# Patient Record
Sex: Male | Born: 1985
Health system: Southern US, Community
[De-identification: ages and names within clinical notes are randomized; demographics above are authoritative.]

## PROBLEM LIST (undated history)

## (undated) ENCOUNTER — Emergency Department: Discharge status: Absent without leave | Payer: Self-pay

## (undated) DIAGNOSIS — R079 Chest pain, unspecified: Secondary | ICD-10-CM

## (undated) DIAGNOSIS — E785 Hyperlipidemia, unspecified: Secondary | ICD-10-CM

## (undated) DIAGNOSIS — R519 Headache, unspecified: Secondary | ICD-10-CM

## (undated) DIAGNOSIS — Z87898 Personal history of other specified conditions: Secondary | ICD-10-CM

## (undated) DIAGNOSIS — R51 Headache: Secondary | ICD-10-CM

## (undated) DIAGNOSIS — I10 Essential (primary) hypertension: Secondary | ICD-10-CM

## (undated) HISTORY — DX: Chest pain, unspecified: R07.9

## (undated) HISTORY — DX: Hyperlipidemia, unspecified: E78.5

## (undated) HISTORY — DX: Headache, unspecified: R51.9

## (undated) HISTORY — DX: Headache: R51

## (undated) HISTORY — DX: Personal history of other specified conditions: Z87.898

## (undated) HISTORY — DX: Essential (primary) hypertension: I10

---
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
## 2014-06-24 ENCOUNTER — Encounter: Payer: Self-pay | Admitting: Cardiology

## 2014-06-24 ENCOUNTER — Ambulatory Visit (INDEPENDENT_AMBULATORY_CARE_PROVIDER_SITE_OTHER): Payer: 59 | Admitting: Cardiology

## 2014-06-24 ENCOUNTER — Encounter: Payer: Self-pay | Admitting: *Deleted

## 2014-06-24 VITALS — BP 128/82 | HR 118 | Ht 65.0 in | Wt 169.1 lb

## 2014-06-24 DIAGNOSIS — R002 Palpitations: Secondary | ICD-10-CM

## 2014-06-24 DIAGNOSIS — R0600 Dyspnea, unspecified: Secondary | ICD-10-CM

## 2014-06-24 DIAGNOSIS — R072 Precordial pain: Secondary | ICD-10-CM

## 2014-06-24 DIAGNOSIS — R079 Chest pain, unspecified: Secondary | ICD-10-CM

## 2014-06-24 LAB — CBC
HEMATOCRIT: 47 % (ref 39.0–52.0)
Hemoglobin: 16 g/dL (ref 13.0–17.0)
MCH: 28.9 pg (ref 26.0–34.0)
MCHC: 34 g/dL (ref 30.0–36.0)
MCV: 84.8 fL (ref 78.0–100.0)
MPV: 10.9 fL (ref 8.6–12.4)
Platelets: 315 10*3/uL (ref 150–400)
RBC: 5.54 MIL/uL (ref 4.22–5.81)
RDW: 13.1 % (ref 11.5–15.5)
WBC: 7.8 10*3/uL (ref 4.0–10.5)

## 2014-06-24 LAB — D-DIMER, QUANTITATIVE (NOT AT ARMC): D DIMER QUANT: 0.27 ug{FEU}/mL (ref 0.00–0.48)

## 2014-06-24 LAB — TSH: TSH: 1.28 u[IU]/mL (ref 0.350–4.500)

## 2014-06-24 NOTE — Assessment & Plan Note (Signed)
Patient states his heart rate runs in the 120 range. Right axis deviation noted on electrocardiogram. Check d-dimer. Check TSH and hemoglobin. 24-hour Holter monitor.

## 2014-06-24 NOTE — Assessment & Plan Note (Signed)
Symptoms atypical. He is noted to have sinus tachycardia and right axis deviation on his electrocardiogram. Recent travel. Check d-dimer to screen for pulmonary embolus.

## 2014-06-24 NOTE — Progress Notes (Signed)
     HPI: 29 year old male for evaluation of chest pain. Patient states that he has been told he has an elevated heart rate for at least one year. Over the past 6 months he has noticed increased dyspnea on exertion. No orthopnea, PND, pedal edema or syncope. In the past week he has had occasions of brief chest pain. It is substernal with some radiation to his left upper extremity. Described as a sharp pain. No associated symptoms. Not pleuritic, positional, exertional. Lasts 2 minutes and resolve spontaneously. Note he recently moved from [place]. He also recently traveled to [place].  Current Outpatient Prescriptions  Medication Sig Dispense Refill  . Aspirin-Acetaminophen-Caffeine (EXCEDRIN PO) Take 1 tablet by mouth as needed (Headaches).     No current facility-administered medications for this visit.    No Known Allergies   Past Medical History  Diagnosis Date  . Chest pain     Past Surgical History  Procedure Laterality Date  . Chest surgery  2003    cosmetic    History   Social History  . Marital Status: Single    Spouse Name: N/A  . Number of Children: N/A  . Years of Education: N/A   Occupational History  .      Computer   Social History Main Topics  . Smoking status: Never Smoker   . Smokeless tobacco: Not on file  . Alcohol Use: 0.0 oz/week    0 Standard drinks or equivalent per week     Comment: Occasional  . Drug Use: No  . Sexual Activity: Not on file   Other Topics Concern  . Not on file   Social History Narrative  . No narrative on file    Family History  Problem Relation Age of Onset  . CAD Mother     CABG at age 860    ROS: no fevers or chills, productive cough, hemoptysis, dysphasia, odynophagia, melena, hematochezia, dysuria, hematuria, rash, seizure activity, orthopnea, PND, pedal edema, claudication. Remaining systems are negative.  Physical Exam:   Blood pressure 128/82, pulse 118, height 5\' 5"  (1.651 m), weight 169 lb 1.9 oz  (76.712 kg), SpO2 98 %.  General:  Well developed/well nourished in NAD Skin warm/dry Patient not depressed No peripheral clubbing Back-normal HEENT-normal/normal eyelids Neck supple/normal carotid upstroke bilaterally; no bruits; no JVD; no thyromegaly chest - CTA/ normal expansion CV - RRR/normal S1 and S2; no murmurs, rubs or gallops;  PMI nondisplaced Abdomen -NT/ND, no HSM, no mass, + bowel sounds, no bruit 2+ femoral pulses, no bruits Ext-no edema, chords, 2+ DP Neuro-grossly nonfocal  ECG sinus tachycardia at a rate of 118. Right axis deviation.

## 2014-06-24 NOTE — Patient Instructions (Signed)
Your physician recommends that you schedule a follow-up appointment in: 8 WEEKS WITH DR Jens SomRENSHAW  Your physician has requested that you have an echocardiogram. Echocardiography is a painless test that uses sound waves to create images of your heart. It provides your doctor with information about the size and shape of your heart and how well your heart's chambers and valves are working. This procedure takes approximately one hour. There are no restrictions for this procedure.   Your physician has recommended that you wear a 24 HOUR holter monitor. Holter monitors are medical devices that record the heart's electrical activity. Doctors most often use these monitors to diagnose arrhythmias. Arrhythmias are problems with the speed or rhythm of the heartbeat. The monitor is a small, portable device. You can wear one while you do your normal daily activities. This is usually used to diagnose what is causing palpitations/syncope (passing out).  Your physician recommends that you HAVE LAB WORK TODAY

## 2014-06-24 NOTE — Assessment & Plan Note (Signed)
D-dimer to screen for pulmonary embolus. Schedule echo to assess LV function.

## 2014-07-01 ENCOUNTER — Ambulatory Visit (HOSPITAL_COMMUNITY): Payer: 59 | Attending: Cardiology

## 2014-07-01 ENCOUNTER — Encounter (INDEPENDENT_AMBULATORY_CARE_PROVIDER_SITE_OTHER): Payer: 59

## 2014-07-01 ENCOUNTER — Encounter: Payer: Self-pay | Admitting: *Deleted

## 2014-07-01 DIAGNOSIS — R002 Palpitations: Secondary | ICD-10-CM

## 2014-07-01 DIAGNOSIS — R079 Chest pain, unspecified: Secondary | ICD-10-CM

## 2014-07-01 NOTE — Progress Notes (Signed)
2D Echo completed. 07/01/2014 

## 2014-07-01 NOTE — Progress Notes (Signed)
Patient ID: Frank LymeLuis Bartee, male   DOB: [birth year], 29 y.o.   MRN: [id number] [facility] 24 hour holter monitor applied to patient.

## 2014-07-08 ENCOUNTER — Telehealth: Payer: Self-pay | Admitting: *Deleted

## 2014-07-08 NOTE — Telephone Encounter (Signed)
Monitor reviewed by dr Jens Somcrenshaw shows sinus rhythm.  pt aware of results

## 2014-09-02 ENCOUNTER — Ambulatory Visit: Payer: 59 | Admitting: Cardiology

## 2015-09-09 ENCOUNTER — Telehealth: Payer: Self-pay | Admitting: General Practice

## 2015-09-09 NOTE — Telephone Encounter (Signed)
°  Relation to ZO:XWRUpt:self Call back [phone] Pharmacy:  Reason for call: pt would like to know if you would accept him as a new pt, states he did a lot of research on you and feels as though you are a great fit for him for his primary care.

## 2015-09-09 NOTE — Telephone Encounter (Signed)
Approving in MD absence. Pt is a 30 yr old male w/ [employer] ins. Okay to schedule at [employer]'s convenience. Please have Pt send medical records prior to appt for review. Thank you.

## 2015-09-15 NOTE — Telephone Encounter (Signed)
appt was scheduled for pt on 09/30/15

## 2015-09-29 ENCOUNTER — Other Ambulatory Visit: Payer: Self-pay

## 2015-09-30 ENCOUNTER — Ambulatory Visit (INDEPENDENT_AMBULATORY_CARE_PROVIDER_SITE_OTHER): Payer: 59 | Admitting: Internal Medicine

## 2015-09-30 ENCOUNTER — Encounter: Payer: Self-pay | Admitting: Internal Medicine

## 2015-09-30 VITALS — BP 126/74 | HR 67 | Temp 98.0°F | Resp 12 | Ht 65.0 in | Wt 161.0 lb

## 2015-09-30 DIAGNOSIS — R030 Elevated blood-pressure reading, without diagnosis of hypertension: Secondary | ICD-10-CM

## 2015-09-30 DIAGNOSIS — IMO0001 Reserved for inherently not codable concepts without codable children: Secondary | ICD-10-CM

## 2015-09-30 DIAGNOSIS — R002 Palpitations: Secondary | ICD-10-CM | POA: Diagnosis not present

## 2015-09-30 DIAGNOSIS — R072 Precordial pain: Secondary | ICD-10-CM

## 2015-09-30 DIAGNOSIS — F411 Generalized anxiety disorder: Secondary | ICD-10-CM

## 2015-09-30 NOTE — Patient Instructions (Signed)
GO TO THE LAB : Get the blood work     GO TO THE FRONT DESK Schedule your next appointment for a  Check up in 3 months  See a counselor    Check the  blood pressure 2 or 3 times a  Week  Be sure your blood pressure is between 110/65 and  145/85. If it is consistently higher or lower, let me know   If you have severe chest pain or palpitations: go to the ER or call the office

## 2015-09-30 NOTE — Progress Notes (Signed)
Pre visit review using our clinic review tool, if applicable. No additional management support is needed unless otherwise documented below in the visit note. 

## 2015-09-30 NOTE — Progress Notes (Signed)
Subjective:    Patient ID: Frank Hancock, male    DOB: [birth year], 30 y.o.   MRN: [id number]  DOS:  09/30/2015 Type of visit - description : New patient Interval history: Patient is here with several concerns. He is having chest pain and palpitations on and off for few weeks, went to see a cardiologist 3 days ago, he was recommended a Holter monitor and a  treadmill exercise test which are pending. The cardiologist noted that he was under a lot of stress and the patient admits it. He moved from [place] a year ago, work is stressful, he had some difficulty sleeping. He was prescribed trazodone and is helping to some extent with the sleeping problem but he continued to be stressed/anxious.    Review of Systems Mild dyspnea on exertion when he runs, not new, going on since he left high school No lower extremity edema Reports shortness of breath but mostly related to nasal congestion. No depression per se He flew back from [place] about 6 weeks ago, denies any calf pain or swelling  Past Medical History:  Diagnosis Date  . Chest pain   . Frequent headaches   . H/O syncope    pre- syncope, no LOC  . History of palpitations   . Hyperlipidemia   . Hypertension         Past Surgical History:  Procedure Laterality Date  . CHEST SURGERY  2001   cosmetic, breast reduction    Social History   Social History  . Marital status: Single    Spouse name: N/A  . Number of children: 0  . Years of education: N/A   Occupational History  . Insurance underwriter - best but     Computer   Social History Main Topics  . Smoking status: Never Smoker  . Smokeless tobacco: Never Used  . Alcohol use 0.0 oz/week     Comment: Occasional  . Drug use: No  . Sexual activity: Not on file   Other Topics Concern  . Not on file   Social History Narrative   Moved from [place] 2016, lives w/ mother      Family History  Problem Relation Age of Onset  . CAD Mother 97    CABG at age 33  .  Hypertension Mother   . Hypertension Sister   . Diabetes Brother   . Hypertension Brother   . Colon cancer Neg Hx   . Prostate cancer Neg Hx        Medication List       Accurate as of 09/30/15 11:59 PM. Always use your most recent med list.          atenolol 25 MG tablet Commonly known as:  TENORMIN Take 25 mg by mouth daily.   EXCEDRIN PO Take 1 tablet by mouth as needed (Headaches).   traZODone 50 MG tablet Commonly known as:  DESYREL Take 50 mg by mouth daily.          Objective:   Physical Exam BP 126/74 (BP Location: Left Arm, Patient Position: Sitting, Cuff Size: Normal)   Pulse 67   Temp 98 F (36.7 C) (Oral)   Resp 12   Ht  (1.651 m)   Wt 161 lb (73 kg)   SpO2 99%   BMI 26.79 kg/m  General:   Well developed, well nourished . NAD.  HEENT:  Normocephalic . Face symmetric, atraumatic Lungs:  CTA B Normal respiratory effort, no intercostal retractions, no accessory  muscle use. Heart: RRR,  no murmur.  no pretibial edema bilaterally . Calves symmetric and not TTP Abdomen:  Not distended, soft, non-tender. No rebound or rigidity.  Skin: Not pale. Not jaundice Neurologic:  alert & oriented X3.  Speech normal, gait appropriate for age and unassisted Psych--  Cognition and judgment appear intact.  Cooperative with normal attention span and concentration.  Behavior appropriate. Slightly anxious but not depressed appearing.    Assessment & Plan:   Assessment HTN dx at a UC ~ 08-2015 rx atenolol Chest pain, palpitations:  Saw cards Dr Jens Som 06-24-14  - d-dimer/echo wnl;  Rx Holter monitor not done Saw Dr Beverely Pace 09-27-15 >>> rx holter-treadmill (not done as of 09-30-15)   PLAN: Chest pain, palpitations:  similar sx last year, saw Dr. Jens Som, d-dimer and echo were normal. Now has seen Dr. Beverely Pace, Holter-treadmill are pending, very low suspicion for a PE, if sx persist consider check a d-dimer. Anxiety: Seems to be work related, had also some  insomnia which is better with trazodone. We discussed counseling or medication, information about local counselors provided, not ready for medications but will let me know when/if ready. Elevated BP, HTN? Recently rx atenolol, BP is excellent, check a BMP and CBC. TSH last year was normal RTC 3 months

## 2015-10-01 DIAGNOSIS — F411 Generalized anxiety disorder: Secondary | ICD-10-CM | POA: Insufficient documentation

## 2015-10-01 DIAGNOSIS — Z09 Encounter for follow-up examination after completed treatment for conditions other than malignant neoplasm: Secondary | ICD-10-CM | POA: Insufficient documentation

## 2015-10-01 LAB — COMPREHENSIVE METABOLIC PANEL
ALBUMIN: 4.8 g/dL (ref 3.5–5.2)
ALT: 70 U/L — ABNORMAL HIGH (ref 0–53)
AST: 29 U/L (ref 0–37)
Alkaline Phosphatase: 64 U/L (ref 39–117)
BUN: 11 mg/dL (ref 6–23)
CALCIUM: 10.1 mg/dL (ref 8.4–10.5)
CHLORIDE: 107 meq/L (ref 96–112)
CO2: 22 mEq/L (ref 19–32)
CREATININE: 0.93 mg/dL (ref 0.40–1.50)
GFR: 100.45 mL/min (ref >60.00)
Glucose, Bld: 81 mg/dL (ref 70–99)
Potassium: 4.3 mEq/L (ref 3.5–5.1)
Sodium: 138 mEq/L (ref 135–145)
Total Bilirubin: 0.5 mg/dL (ref 0.2–1.2)
Total Protein: 8 g/dL (ref 6.0–8.3)

## 2015-10-01 LAB — CBC WITH DIFFERENTIAL/PLATELET
BASOS PCT: 0.4 % (ref 0.0–3.0)
Basophils Absolute: 0 10*3/uL (ref 0.0–0.1)
EOS ABS: 0.1 10*3/uL (ref 0.0–0.7)
EOS PCT: 1.4 % (ref 0.0–5.0)
HEMATOCRIT: 47.1 % (ref 39.0–52.0)
HEMOGLOBIN: 15.6 g/dL (ref 13.0–17.0)
LYMPHS PCT: 37.1 % (ref 12.0–46.0)
Lymphs Abs: 2.5 10*3/uL (ref 0.7–4.0)
MCHC: 33.1 g/dL (ref 30.0–36.0)
MCV: 84.3 fl (ref 78.0–100.0)
Monocytes Absolute: 0.5 10*3/uL (ref 0.1–1.0)
Monocytes Relative: 7.6 % (ref 3.0–12.0)
Neutro Abs: 3.6 10*3/uL (ref 1.4–7.7)
Neutrophils Relative %: 53.5 % (ref 43.0–77.0)
Platelets: 274 10*3/uL (ref 150.0–400.0)
RBC: 5.59 Mil/uL (ref 4.22–5.81)
RDW: 14 % (ref 11.5–15.5)
WBC: 6.7 10*3/uL (ref 4.0–10.5)

## 2015-10-01 NOTE — Assessment & Plan Note (Signed)
Chest pain, palpitations:  similar sx last year, saw Dr. Jens Som, d-dimer and echo were normal. Now has seen Dr. Beverely Pace, Holter-treadmill are pending, very low suspicion for a PE, if sx persist consider check a d-dimer. Anxiety: Seems to be work related, had also some insomnia which is better with trazodone. We discussed counseling or medication, information about local counselors provided, not ready for medications but will let me know when/if ready. Elevated BP, HTN? Recently rx atenolol, BP is excellent, check a BMP and CBC. TSH last year was normal RTC 3 months

## 2015-11-06 NOTE — Progress Notes (Deleted)
      HPI: FU CP. Echo 4/16 showed normal LV function; grade 1 DD. Holter 4/16 showed sinus rhythm. Since last seen,   Current Outpatient Prescriptions  Medication Sig Dispense Refill  . Aspirin-Acetaminophen-Caffeine (EXCEDRIN PO) Take 1 tablet by mouth as needed (Headaches).    Marland Kitchen. atenolol (TENORMIN) 25 MG tablet Take 25 mg by mouth daily.    . traZODone (DESYREL) 50 MG tablet Take 50 mg by mouth daily.     No current facility-administered medications for this visit.      Past Medical History:  Diagnosis Date  . Chest pain   . Frequent headaches   . H/O syncope    pre- syncope, no LOC  . History of palpitations   . Hyperlipidemia   . Hypertension         Past Surgical History:  Procedure Laterality Date  . CHEST SURGERY  2001   cosmetic, breast reduction    Social History   Social History  . Marital status: Single    Spouse name: N/A  . Number of children: 0  . Years of education: N/A   Occupational History  . 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 Smoking status: Never Smoker  . Smokeless tobacco: Never Used  . Alcohol use 0.0 oz/week     Comment: Occasional  . Drug use: No  . Sexual activity: Not on file   Other Topics Concern  . Not on file   Social History Narrative   Moved from [place] 2016, lives w/ mother     Family History  Problem Relation Age of Onset  . CAD Mother 7364    CABG at age 31  . Hypertension Mother   . Hypertension Sister   . Diabetes Brother   . Hypertension Brother   . Colon cancer Neg Hx   . Prostate cancer Neg Hx     ROS: no fevers or chills, productive cough, hemoptysis, dysphasia, odynophagia, melena, hematochezia, dysuria, hematuria, rash, seizure activity, orthopnea, PND, pedal edema, claudication. Remaining systems are negative.  Physical Exam: Well-developed well-nourished in no acute distress.  Skin is warm and dry.  HEENT is normal.  Neck is supple.  Chest is clear to  auscultation with normal expansion.  Cardiovascular exam is regular rate and rhythm.  Abdominal exam nontender or distended. No masses palpated. Extremities show no edema. neuro grossly intact  ECG

## 2015-11-10 ENCOUNTER — Ambulatory Visit: Payer: 59 | Admitting: Cardiology

## 2015-12-21 ENCOUNTER — Ambulatory Visit (INDEPENDENT_AMBULATORY_CARE_PROVIDER_SITE_OTHER): Payer: 59 | Admitting: Internal Medicine

## 2015-12-21 ENCOUNTER — Encounter (INDEPENDENT_AMBULATORY_CARE_PROVIDER_SITE_OTHER): Payer: Self-pay

## 2015-12-21 ENCOUNTER — Encounter: Payer: Self-pay | Admitting: Internal Medicine

## 2015-12-21 VITALS — BP 108/72 | HR 92 | Temp 98.2°F | Resp 14 | Ht 65.0 in | Wt 164.2 lb

## 2015-12-21 DIAGNOSIS — Z23 Encounter for immunization: Secondary | ICD-10-CM | POA: Diagnosis not present

## 2015-12-21 DIAGNOSIS — Z114 Encounter for screening for human immunodeficiency virus [HIV]: Secondary | ICD-10-CM

## 2015-12-21 DIAGNOSIS — Z0000 Encounter for general adult medical examination without abnormal findings: Secondary | ICD-10-CM | POA: Diagnosis not present

## 2015-12-21 LAB — HEPATIC FUNCTION PANEL
ALBUMIN: 5.1 g/dL (ref 3.5–5.2)
ALK PHOS: 74 U/L (ref 39–117)
ALT: 32 U/L (ref 0–53)
AST: 20 U/L (ref 0–37)
BILIRUBIN DIRECT: 0.1 mg/dL (ref 0.0–0.3)
TOTAL PROTEIN: 8.7 g/dL — AB (ref 6.0–8.3)
Total Bilirubin: 0.6 mg/dL (ref 0.2–1.2)

## 2015-12-21 LAB — LIPID PANEL
CHOL/HDL RATIO: 5
CHOLESTEROL: 182 mg/dL (ref 0–200)
HDL: 36.7 mg/dL — AB (ref >39.00)
LDL CALC: 108 mg/dL — AB (ref 0–99)
NonHDL: 145.28
TRIGLYCERIDES: 188 mg/dL — AB (ref 0.0–149.0)
VLDL: 37.6 mg/dL (ref 0.0–40.0)

## 2015-12-21 LAB — TSH: TSH: 1.36 u[IU]/mL (ref 0.35–4.50)

## 2015-12-21 NOTE — Progress Notes (Signed)
Subjective:    Patient ID: Frank Hancock, male    DOB: 09-02-1985, 30 y.o.   MRN: [id number]  DOS:  12/21/2015 Type of visit - description : CPX Interval history: No major concerns. Stop essentially all meds medications few weeks ago and feeling well    Review of Systems  Constitutional: No fever. No chills. No unexplained wt changes. No unusual sweats  HEENT: No dental problems, no ear discharge, no facial swelling, no voice changes. No eye discharge, no eye  redness , no  intolerance to light   Respiratory: No wheezing , no  difficulty breathing. No cough , no mucus production  Cardiovascular: since last visit, cardiac workup was negative, chest pain palpitations have significantly decreased, from time to time has a left-sided chest pain that lasts few seconds.  GI: no nausea, no vomiting, no diarrhea , no  abdominal pain.  No blood in the stools. No dysphagia, no odynophagia    Endocrine: No polyphagia, no polyuria , no polydipsia  GU: No dysuria, gross hematuria, difficulty urinating. No urinary urgency, no frequency.  Musculoskeletal: No joint swellings or unusual aches or pains  Skin: No change in the color of the skin, palor , no  Rash  Allergic, immunologic: No environmental allergies , no  food allergies  Neurological: No dizziness no  syncope. No headaches. No diplopia, no slurred, no slurred speech, no motor deficits, no facial  Numbness  Hematological: No enlarged lymph nodes, no easy bruising , no unusual bleedings  Psychiatry: No suicidal ideas, no hallucinations, no beavior problems, no confusion.  No unusual/severe anxiety, no depression   Past Medical History:  Diagnosis Date  . Chest pain   . Frequent headaches   . H/O syncope    pre- syncope, no LOC  . History of palpitations   . Hyperlipidemia   . Hypertension         Past Surgical History:  Procedure Laterality Date  . CHEST SURGERY  2001   cosmetic, breast reduction    Social History     Social History  . Marital status: Single    Spouse name: N/A  . Number of children: 0  . Years of education: N/A   Occupational History  . Insurance underwriter - best but     Computer   Social History Main Topics  . Smoking status: Never Smoker  . Smokeless tobacco: Never Used  . Alcohol use 0.0 oz/week     Comment: Occasional  . Drug use: No  . Sexual activity: Not on file   Other Topics Concern  . Not on file   Social History Narrative   Moved from [place] 2016, lives w/ mother      Family History  Problem Relation Age of Onset  . CAD Mother 27    CABG at age 54  . Hypertension Mother   . Hypertension Sister   . Diabetes Brother   . Hypertension Brother   . Colon cancer Neg Hx   . Prostate cancer Neg Hx        Medication List       Accurate as of 12/21/15  2:02 PM. Always use your most recent med list.          atenolol 25 MG tablet Commonly known as:  TENORMIN Take 25 mg by mouth daily.   EXCEDRIN PO Take 1 tablet by mouth as needed (Headaches).   traZODone 50 MG tablet Commonly known as:  DESYREL Take 50 mg by mouth daily.  Objective:   Physical Exam BP 108/72 (BP Location: Right Arm, Patient Position: Sitting, Cuff Size: Normal)   Pulse 92   Temp 98.2 F (36.8 C) (Oral)   Resp 14   Ht 5\' 5"  (1.651 m)   Wt 164 lb 4 oz (74.5 kg)   SpO2 99%   BMI 27.33 kg/m   General:   Well developed, well nourished . NAD.  Neck: No  thyromegaly  HEENT:  Normocephalic . Face symmetric, atraumatic Lungs:  CTA B Normal respiratory effort, no intercostal retractions, no accessory muscle use. Heart: RRR,  no murmur.  No pretibial edema bilaterally  Abdomen:  Not distended, soft, non-tender. No rebound or rigidity.   Skin: Exposed areas without rash. Not pale. Not jaundice Neurologic:  alert & oriented X3.  Speech normal, gait appropriate for age and unassisted Strength symmetric and appropriate for age.  Psych: Cognition and judgment  appear intact.  Cooperative with normal attention span and concentration.  Behavior appropriate. No anxious or depressed appearing.    Assessment & Plan:   Assessment HTN dx at a UC ~ 08-2015 rx atenolol Chest pain, palpitations, h/o pre-syncope:  --Saw cards Dr Jens Somrenshaw 06-24-14  - d-dimer/echo wnl;  Rx Holter monitor not done --Saw Dr Beverely Paceheek 09-27-15 >>>  holter-treadmill --WNL  Plan: Palpitations, chest pain: Workup negative, sx going away. Rec  no further eval. Elevated BP: Self d/c atenolol few weeks ago, BP today is normal, recommend ambulatory BPs from time to time Anxiety: Much improved, not needing any medication. Increase LFTs: See most recent labs, recheck. (Not a drinker) RTC one year

## 2015-12-21 NOTE — Assessment & Plan Note (Addendum)
Td today; declined flu shot  Never had a cscope  Labs: LFTs, TSH, FLP, HIV Patient education: Diet, exercise, safe sex, self testicular exam

## 2015-12-21 NOTE — Patient Instructions (Signed)
GO TO THE LAB : Get the blood work     GO TO THE FRONT DESK Schedule your next appointment for a physical exam in one year     Check the  blood pressure   monthly  Be sure your blood pressure is between 110/65 and  145/85. If it is consistently higher or lower, let me know   Safe Sex Safe sex is about reducing the risk of giving or getting a sexually transmitted disease (STD). STDs are spread through sexual contact involving the genitals, mouth, or rectum. Some STDs can be cured and others cannot. Safe sex can also prevent unintended pregnancies.  WHAT ARE SOME SAFE SEX PRACTICES?  Limit your sexual activity to only one partner who is having sex with only you.  Talk to your partner about his or her past partners, past STDs, and drug use.  Use a condom every time you have sexual intercourse. This includes vaginal, oral, and anal sexual activity. Both females and males should wear condoms during oral sex. Only use latex or polyurethane condoms and water-based lubricants. Using petroleum-based lubricants or oils to lubricate a condom will weaken the condom and increase the chance that it will break. The condom should be in place from the beginning to the end of sexual activity. Wearing a condom reduces, but does not completely eliminate, your risk of getting or giving an STD. STDs can be spread by contact with infected body fluids and skin.  Get vaccinated for hepatitis B and HPV.  Avoid alcohol and recreational drugs, which can affect your judgment. You may forget to use a condom or participate in high-risk sex.  For females, avoid douching after sexual intercourse. Douching can spread an infection farther into the reproductive tract.  Check your body for signs of sores, blisters, rashes, or unusual discharge. See your health care provider if you notice any of these signs.  Avoid sexual contact if you have symptoms of an infection or are being treated for an STD. If you or your partner  has herpes, avoid sexual contact when blisters are present. Use condoms at all other times.  If you are at risk of being infected with HIV, it is recommended that you take a prescription medicine daily to prevent HIV infection. This is called pre-exposure prophylaxis (PrEP). You are considered at risk if:  You are a man who has sex with other men (MSM).  You are a heterosexual man or woman who is sexually active with more than one partner.  You take drugs by injection.  You are sexually active with a partner who has HIV.  Talk with your health care provider about whether you are at high risk of being infected with HIV. If you choose to begin PrEP, you should first be tested for HIV. You should then be tested every 3 months for as long as you are taking PrEP.  See your health care provider for regular screenings, exams, and tests for other STDs. Before having sex with a new partner, each of you should be screened for STDs and should talk about the results with each other. WHAT ARE THE BENEFITS OF SAFE SEX?   There is less chance of getting or giving an STD.  You can prevent unwanted or unintended pregnancies.  By discussing safe sex concerns with your partner, you may increase feelings of intimacy, comfort, trust, and honesty between the two of you.   This information is not intended to replace advice given to you by your  health care provider. Make sure you discuss any questions you have with your health care provider.   Document Released: 03/30/2004 Document Revised: 03/13/2014 Document Reviewed: 08/14/2011 Elsevier Interactive Patient Education 2016 [employer]      Testicular Self-Exam A self-examination of your testicles involves looking at and feeling your testicles for abnormal lumps or swelling. Several things can cause swelling, lumps, or pain in your testicles. Some of these causes are:  Injuries.  Inflammation.  Infection.  Accumulation of fluids around your  testicle (hydrocele).  Twisted testicles (testicular torsion).  Testicular cancer. Self-examination of the testicles and groin areas may be advised if you are at risk for testicular cancer. Risks for testicular cancer include:  An undescended testicle (cryptorchidism).  A history of previous testicular cancer.  A family history of testicular cancer. The testicles are easiest to examine after warm baths or showers and are more difficult to examine when you are cold. This is because the muscles attached to the testicles retract and pull them up higher or into the abdomen. Follow these steps while you are standing:  Hold your penis away from your body.  Roll one testicle between your thumb and forefinger, feeling the entire testicle.  Roll the other testicle between your thumb and forefinger, feeling the entire testicle. Feel for lumps, swelling, or discomfort. A normal testicle is egg shaped and feels firm. It is smooth and not tender. The spermatic cord can be felt as a firm spaghetti-like cord at the back of your testicle. It is also important to examine the crease between the front of your leg and your abdomen. Feel for any bumps that are tender. These could be enlarged lymph nodes.    This information is not intended to replace advice given to you by your health care provider. Make sure you discuss any questions you have with your health care provider.   Document Released: 05/29/2000 Document Revised: 10/23/2012 Document Reviewed: 08/12/2012 Elsevier Interactive Patient Education [employer]2016 Else[employer] Inc[employer]

## 2015-12-21 NOTE — Progress Notes (Signed)
Pre visit review using our clinic review tool, if applicable. No additional management support is needed unless otherwise documented below in the visit note. 

## 2015-12-22 LAB — HIV ANTIBODY (ROUTINE TESTING W REFLEX): HIV 1&2 Ab, 4th Generation: NONREACTIVE

## 2015-12-22 NOTE — Assessment & Plan Note (Signed)
Palpitations, chest pain: Workup negative, sx going away. Rec  no further eval. Elevated BP: Self d/c atenolol few weeks ago, BP today is normal, recommend ambulatory BPs from time to time Anxiety: Much improved, not needing any medication. Increase LFTs: See most recent labs, recheck. (Not a drinker) RTC one year

## 2016-03-20 ENCOUNTER — Other Ambulatory Visit (INDEPENDENT_AMBULATORY_CARE_PROVIDER_SITE_OTHER): Payer: Self-pay | Admitting: Otolaryngology

## 2016-03-20 DIAGNOSIS — J329 Chronic sinusitis, unspecified: Secondary | ICD-10-CM

## 2016-03-22 ENCOUNTER — Other Ambulatory Visit: Payer: 59

## 2016-03-24 ENCOUNTER — Ambulatory Visit
Admission: RE | Admit: 2016-03-24 | Discharge: 2016-03-24 | Disposition: A | Discharge status: Home / Self Care | Payer: Commercial Managed Care - HMO | Source: Ambulatory Visit | Attending: Otolaryngology | Admitting: Otolaryngology

## 2016-03-24 DIAGNOSIS — J329 Chronic sinusitis, unspecified: Secondary | ICD-10-CM

## 2016-12-21 ENCOUNTER — Encounter: Payer: 59 | Admitting: Internal Medicine

## 2017-01-04 ENCOUNTER — Ambulatory Visit (INDEPENDENT_AMBULATORY_CARE_PROVIDER_SITE_OTHER): Payer: 59 | Admitting: Internal Medicine

## 2017-01-04 ENCOUNTER — Encounter: Payer: Self-pay | Admitting: Internal Medicine

## 2017-01-04 VITALS — BP 128/80 | HR 108 | Temp 98.3°F | Resp 14 | Ht 65.0 in | Wt 160.4 lb

## 2017-01-04 DIAGNOSIS — Z1159 Encounter for screening for other viral diseases: Secondary | ICD-10-CM

## 2017-01-04 DIAGNOSIS — F411 Generalized anxiety disorder: Secondary | ICD-10-CM | POA: Diagnosis not present

## 2017-01-04 DIAGNOSIS — Z114 Encounter for screening for human immunodeficiency virus [HIV]: Secondary | ICD-10-CM

## 2017-01-04 DIAGNOSIS — Z0000 Encounter for general adult medical examination without abnormal findings: Secondary | ICD-10-CM

## 2017-01-04 LAB — CBC WITH DIFFERENTIAL/PLATELET
BASOS ABS: 0 10*3/uL (ref 0.0–0.1)
Basophils Relative: 0.4 % (ref 0.0–3.0)
EOS PCT: 0.3 % (ref 0.0–5.0)
Eosinophils Absolute: 0 10*3/uL (ref 0.0–0.7)
HEMATOCRIT: 45.7 % (ref 39.0–52.0)
Hemoglobin: 15.1 g/dL (ref 13.0–17.0)
LYMPHS PCT: 34.2 % (ref 12.0–46.0)
Lymphs Abs: 3.4 10*3/uL (ref 0.7–4.0)
MCHC: 33 g/dL (ref 30.0–36.0)
MCV: 84.5 fl (ref 78.0–100.0)
MONOS PCT: 7.5 % (ref 3.0–12.0)
Monocytes Absolute: 0.7 10*3/uL (ref 0.1–1.0)
Neutro Abs: 5.7 10*3/uL (ref 1.4–7.7)
Neutrophils Relative %: 57.6 % (ref 43.0–77.0)
Platelets: 288 10*3/uL (ref 150.0–400.0)
RBC: 5.4 Mil/uL (ref 4.22–5.81)
RDW: 13 % (ref 11.5–15.5)
WBC: 9.9 10*3/uL (ref 4.0–10.5)

## 2017-01-04 LAB — COMPREHENSIVE METABOLIC PANEL
ALK PHOS: 57 U/L (ref 39–117)
ALT: 42 U/L (ref 0–53)
AST: 22 U/L (ref 0–37)
Albumin: 4.7 g/dL (ref 3.5–5.2)
BILIRUBIN TOTAL: 0.8 mg/dL (ref 0.2–1.2)
BUN: 12 mg/dL (ref 6–23)
CALCIUM: 9.9 mg/dL (ref 8.4–10.5)
CO2: 28 meq/L (ref 19–32)
Chloride: 103 mEq/L (ref 96–112)
Creatinine, Ser: 0.85 mg/dL (ref 0.40–1.50)
GFR: 110.55 mL/min (ref >60.00)
Glucose, Bld: 97 mg/dL (ref 70–99)
POTASSIUM: 3.4 meq/L — AB (ref 3.5–5.1)
Sodium: 139 mEq/L (ref 135–145)
TOTAL PROTEIN: 7.9 g/dL (ref 6.0–8.3)

## 2017-01-04 LAB — TSH: TSH: 2.02 u[IU]/mL (ref 0.35–4.50)

## 2017-01-04 MED ORDER — ESCITALOPRAM OXALATE 10 MG PO TABS: 10.0000 mg | ORAL_TABLET | Freq: Every day | ORAL | 1 refills | Status: DC

## 2017-01-04 NOTE — Progress Notes (Signed)
Pre visit review using our clinic review tool, if applicable. No additional management support is needed unless otherwise documented below in the visit note. 

## 2017-01-04 NOTE — Patient Instructions (Addendum)
GO TO THE LAB : Get the blood work     GO TO THE FRONT DESK Schedule your next appointment for a follow-up in 4 weeks   Start Lexapro, 1 tablet every night.  Watch for suicidal ideas.  Suicide hotline 1 800 [phone]

## 2017-01-04 NOTE — Progress Notes (Signed)
Subjective:    Patient ID: Frank Hancock, male    DOB: 1985/07/04, 31 y.o.   MRN: [id number]  DOS:  01/04/2017 Type of visit - description : cpx, here with his mother Interval history: In addition to CPX he has other concerns   Review of Systems Patient went to [place], has a girlfriend there, before they were sexually active they both had complete physical, they tested negative for HIV, RPR, etc. They did a fecal test for H. pylori and he came back positive, unclear to me why they check that. After the testing they were sexually active, the condom broke once and the patient is extremely anxious about it. He came back to the [place] on 12/02/2016. He started to take the H. pylori medication prescribed in [place], shortly after he developed diarrhea, nausea, low-grade fever, a "chill" sensation in the back. Eventually went to urgent care approximately 12/18/2016, H. pylori medication was discontinued, he was prescribed dexamethasone and Levaquin.  At this point, his most pressing symptom is anxiety, he is extremely anxious about HIV.  In fact, he reports gotl tested twice  for HIV since then and they came back negative. Cannot sleep, when asked admits to some depression triggered by the extreme anxiety.  Suicidal ideas has crossed his mind, last time was last week.  At this point denies fever or chills.  Still has occasional nausea and diarrhea but no vomiting or blood in the stools No dysuria or gross hematuria + Poor sleep Occasionally has seen a skin lesion in the arms but no other rashes.   Other than above, a 14 point review of systems is negative      Past Medical History:  Diagnosis Date  . Chest pain   . Frequent headaches   . H/O syncope    pre- syncope, no LOC  . History of palpitations   . Hyperlipidemia   . Hypertension         Past Surgical History:  Procedure Laterality Date  . CHEST SURGERY  2001   cosmetic, breast reduction    Social History   Social  History  . Marital status: Single    Spouse name: N/A  . Number of children: 0  . Years of education: N/A   Occupational History  . 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 Smoking status: Never Smoker  . Smokeless tobacco: Never Used  . Alcohol use 0.0 oz/week     Comment: Occasional  . Drug use: No  . Sexual activity: Not on file   Other Topics Concern  . Not on file   Social History Narrative   Born in [place]   Moved from [place] 2016, lives w/ mother       Family History  Problem Relation Age of Onset  . Hypertension Mother   . Hypertension Sister   . Diabetes Brother   . Hypertension Brother   . Heart defect Maternal Grandmother        valve repair  . Colon cancer Neg Hx   . Prostate cancer Neg Hx   . CAD Neg Hx       Allergies as of 01/04/2017   No Known Allergies     Medication List       Accurate as of 01/04/17 11:59 PM. Always use your most recent med list.          escitalopram 10 MG tablet Commonly known as:  LEXAPRO  Take 1 tablet (10 mg total) by mouth daily.   EXCEDRIN PO Take 1 tablet by mouth as needed (Headaches).   LORazepam 1 MG tablet Commonly known as:  ATIVAN Take 1 mg by mouth at bedtime as needed.          Objective:   Physical Exam BP 128/80 (BP Location: Left Arm, Patient Position: Sitting, Cuff Size: Small)   Pulse (!) 108   Temp 98.3 F (36.8 C) (Oral)   Resp 14   Ht 5\' 5"  (1.651 m)   Wt 160 lb 6 oz (72.7 kg)   SpO2 98%   BMI 26.69 kg/m   General:   Well developed, well nourished . NAD.  Neck: No  thyromegaly  HEENT:  Normocephalic . Face symmetric, atraumatic Lungs:  CTA B Normal respiratory effort, no intercostal retractions, no accessory muscle use. Heart: RRR,  no murmur.  No pretibial edema bilaterally  Abdomen:  Not distended, soft, non-tender. No rebound or rigidity.   Skin: He showed me a couple of skin lesions at the inner aspect of the right arm, they are  round, less than 1 mm, slightly red. Neurologic:  alert & oriented X3.  Speech normal, gait appropriate for age and unassisted Strength symmetric and appropriate for age.  Psych: Cognition and judgment appear intact.  Cooperative with normal attention span and concentration.  Behavior appropriate. No anxious or depressed appearing.     Assessment & Plan:   Assessment (new pt 09-2015) HTN dx at a UC ~ 08-2015 rx atenolol HAs, frequent, Excedrin  Chest pain, palpitations, h/o pre-syncope:  --Saw cards Dr Jens Somrenshaw 06-24-14  - d-dimer/echo wnl;  Rx Holter monitor not done --Saw Dr Beverely Paceheek 09-27-15 >>>  holter-treadmill --WNL  PLAN: Anxiety: This is the main pt's problem today, this is a chronic issue, recently exacerbated by a situation that may have put him at risk to acquire HIV.  His chances to get HIV were neil,  See HPI He has some depression, I think  triggered by feeling  so anxious. Occasional suicidal ideas. Recommend counseling, information provided I think he needs to start a SSRI, patient agrees.  Will start Lexapro, suicidal risk discussed in great detail with the patient and his mother who is here.  He knows to call and seek for help if S/I come again + H. pylori testing: Done elsewhere.  Would recommend breath test in few weeks HIV exposure?  The patient showed me results done at [facility] done in [place] lately, he had a hepatitis A, B, and C serology negative. HIV 1 and HIV 2  RNA  were negative. Will come back in 4 weeks  Today, in addition to the physical exam, I spent more than  20  min with the patient and his mother. >50% of the time counseling regards anxiety, going over all his recent medical history, counseling, reassuring him.  A number of questions were answered to the best of my ability.  We also discussed extensively risk of suicide.

## 2017-01-04 NOTE — Assessment & Plan Note (Addendum)
-  Td 2017 -CCS: Never had a cscope  -Labs: CMP, CBC, TSH, HIV, hep C, RPR  -Diet, exercise discussed

## 2017-01-05 LAB — RPR: RPR Ser Ql: NONREACTIVE

## 2017-01-05 LAB — HIV ANTIBODY (ROUTINE TESTING W REFLEX): HIV 1&2 Ab, 4th Generation: NONREACTIVE

## 2017-01-05 LAB — HEPATITIS C ANTIBODY
HEP C AB: NONREACTIVE
SIGNAL TO CUT-OFF: 0 (ref <1.00)

## 2017-01-05 NOTE — Assessment & Plan Note (Signed)
Anxiety: This is the main pt's problem today, this is a chronic issue, recently exacerbated by a situation that may have put him at risk to acquire HIV.  His chances to get HIV were neil,  See HPI He has some depression, I think  triggered by feeling  so anxious. Occasional suicidal ideas. Recommend counseling, information provided I think he needs to start a SSRI, patient agrees.  Will start Lexapro, suicidal risk discussed in great detail with the patient and his mother who is here.  He knows to call and seek for help if S/I come again + H. pylori testing: Done elsewhere.  Would recommend breath test in few weeks HIV exposure?  The patient showed me results done at [facility] done in [place] lately, he had a hepatitis A, B, and C serology negative. HIV 1 and HIV 2  RNA  were negative. Will come back in 4 weeks

## 2017-01-10 ENCOUNTER — Ambulatory Visit (INDEPENDENT_AMBULATORY_CARE_PROVIDER_SITE_OTHER): Payer: 59 | Admitting: Internal Medicine

## 2017-01-10 ENCOUNTER — Encounter: Payer: Self-pay | Admitting: Internal Medicine

## 2017-01-10 ENCOUNTER — Encounter: Payer: Commercial Managed Care - HMO | Admitting: Internal Medicine

## 2017-01-10 VITALS — BP 124/78 | HR 118 | Temp 97.9°F | Resp 14 | Ht 65.0 in | Wt 155.1 lb

## 2017-01-10 DIAGNOSIS — R208 Other disturbances of skin sensation: Secondary | ICD-10-CM | POA: Diagnosis not present

## 2017-01-10 DIAGNOSIS — M545 Low back pain, unspecified: Secondary | ICD-10-CM

## 2017-01-10 DIAGNOSIS — R197 Diarrhea, unspecified: Secondary | ICD-10-CM

## 2017-01-10 LAB — SEDIMENTATION RATE: Sed Rate: 13 mm/hr (ref 0–15)

## 2017-01-10 LAB — VITAMIN B12: Vitamin B-12: 665 pg/mL (ref 211–911)

## 2017-01-10 LAB — FOLATE: Folate: 14.3 ng/mL (ref >5.9)

## 2017-01-10 MED ORDER — CYCLOBENZAPRINE HCL 10 MG PO TABS: 10.0000 mg | ORAL_TABLET | Freq: Every evening | ORAL | 0 refills | Status: DC | PRN

## 2017-01-10 MED ORDER — MELOXICAM 15 MG PO TABS: 15.0000 mg | ORAL_TABLET | Freq: Every day | ORAL | 0 refills | Status: DC

## 2017-01-10 NOTE — Progress Notes (Signed)
Subjective:    Patient ID: Frank Hancock, male    DOB: March 05, 1986, 31 y.o.   MRN: [id number]  DOS:  01/10/2017 Type of visit - description : acute Interval history: Has a number of symptoms: 3 weeks history of mid low back pain with no radiation, worse when he sits for too long, decrease when he lays down. Denies any bladder or bowel incontinence Continue w/ an "icy hot" and  feeling of "burning", not only at the back ( T and L spine) but he also stated at the arms and legs including palms and feet. Has nausea, diarrhea "every day". Also urinary frequency, "urinate 10 times a day".  No dysuria no gross hematuria Has developed a rash for the last month, not itchy, only on the flanks and extremities.  Anxiety: Started Lexapro, reports he is feeling more calm.    Review of Systems   Past Medical History:  Diagnosis Date  . Chest pain   . Frequent headaches   . H/O syncope    pre- syncope, no LOC  . History of palpitations   . Hyperlipidemia   . Hypertension         Past Surgical History:  Procedure Laterality Date  . CHEST SURGERY  2001   cosmetic, breast reduction    Social History   Socioeconomic History  . Marital status: Single    Spouse name: Not on file  . Number of children: 0  . Years of education: Not on file  . Highest education level: Not on file  Social Needs  . Financial resource strain: Not on file  . Food insecurity - worry: Not on file  . Food insecurity - inability: Not on file  . Transportation needs - medical: Not on file  . Transportation needs - non-medical: Not on file  Occupational History  . Occupation: Insurance underwritercomputer tech - [employer]    Comment: Computer  Tobacco Use  . Smoking status: Never Smoker  . Smokeless tobacco: Never Used  Substance and Sexual Activity  . Alcohol use: Yes    Alcohol/week: 0.0 oz    Comment: Occasional  . Drug use: No  . Sexual activity: Not on file  Other Topics Concern  . Not on file  Social History  Narrative   Born in [place]   Moved from [place] 2016, lives w/ mother       Allergies as of 01/10/2017   No Known Allergies     Medication List        Accurate as of 01/10/17  8:36 PM. Always use your most recent med list.          cyclobenzaprine 10 MG tablet Commonly known as:  FLEXERIL Take 1 tablet (10 mg total) at bedtime as needed by mouth for muscle spasms.   escitalopram 10 MG tablet Commonly known as:  LEXAPRO Take 1 tablet (10 mg total) by mouth daily.   EXCEDRIN PO Take 1 tablet by mouth as needed (Headaches).   LORazepam 1 MG tablet Commonly known as:  ATIVAN Take 1 mg by mouth at bedtime as needed.   meloxicam 15 MG tablet Commonly known as:  MOBIC Take 1 tablet (15 mg total) daily by mouth.          Objective:   Physical Exam BP 124/78 (BP Location: Left Arm, Patient Position: Sitting, Cuff Size: Small)   Pulse (!) 118   Temp 97.9 F (36.6 C) (Oral)   Resp 14   Ht 5\' 5"  (1.651  m)   Wt 155 lb 2 oz (70.4 kg)   SpO2 97%   BMI 25.81 kg/m  General:   Well developed, well nourished . NAD.  HEENT:  Normocephalic . Face symmetric, atraumatic Tongue indeed slt white/dry but no cotton like findings MSK : slt TTP around the L-S spine Abdomen:  Not distended, soft, non-tender. No rebound or rigidity.   Skin: few minute < 1mm red papular slt red lesion at sides of the abdomen Neurologic:  alert & oriented X3.  Speech normal, gait appropriate for age and unassisted Motor DTRs symmetric   Psych--  Cognition and judgment appear intact.  Cooperative with normal attention span and concentration.  Behavior appropriate. No anxious or depressed appearing.     Assessment & Plan:    Assessment (new pt 09-2015) HTN dx at a UC ~ 08-2015 rx atenolol HAs, frequent, Excedrin  Chest pain, palpitations, h/o pre-syncope:  --Saw cards Dr Jens Somrenshaw 06-24-14  - d-dimer/echo wnl;  Rx Holter monitor not done --Saw Dr Beverely Paceheek 09-27-15 >>>  holter-treadmill  --WNL  PLAN: Multiple sx: I asked the patient what is the sx that bothers him the most and he said back pain and  the icy-cold feeling he has  Recent normal labs including RPR, hep C, HIV being negative which is reassuring. Back pain: Stretching, meloxicam, Flexeril.  Consider referral to sports medicine Dysesthesias: Icy-hot feeling is of unclear etiology ; recent RPR, hep C and HIV negative.  Will check B12, folic acid, sed rate (they were wnl, pt called and aware).  Consider further eval with neurology. Nausea, daily diarrhea: Started before he started SSRIs.  We will check for C. difficile, culture and WBCs in the stool Also, requests  studies for diseases like [place]ikungunya and [place] because he was in [place] and is not feeling well. He could have been exposed to [place] given visit to [place], has reportedly low-grade fever, rash, arthralgias and GI symptoms but  there is no specific treatment if he comes back + and he does not look ill.  Chikungunya is unlikely as he has no intense or severe sx. In the context of a constellations of symptoms, particularly anxiety, I do not think testing is indicated.  he nevertheless is quite adamant about being checked.  We will consider an ID referral as I typically do not check for those type of illnesses. Other infections might need to be considered, brucellosis, etc RTC as rec ~ 3 weeks   Today, I spent more than 25  min with the patient: >50% of the time counseling regards his multiple symptoms, and the reason why I do not think is indicated to check for zica, Chikungunya

## 2017-01-10 NOTE — Progress Notes (Signed)
Pre visit review using our clinic review tool, if applicable. No additional management support is needed unless otherwise documented below in the visit note. 

## 2017-01-10 NOTE — Patient Instructions (Signed)
GO TO THE LAB : Get the blood work     For  back pain: Take Flexeril at bedtime, is a muscle relaxant, will cause drowsiness.  Meloxicam is a Motrin-like medication, take 1 daily with food. as needed for pain.  Always take it with food because may cause gastritis and ulcers.  If you notice nausea, stomach pain, change in the color of stools --->  Stop the medicine and let us know  Stretching    Back Exercises If you have pain in your back, do these exercises 2-3 times each day or as told by your doctor. When the pain goes away, do the exercises once each day, but repeat the steps more times for each exercise (do more repetitions). If you do not have pain in your back, do these exercises once each day or as told by your doctor. Exercises Single Knee to Chest  Do these steps 3-5 times in a row for each leg: 1. Lie on your back on a firm bed or the floor with your legs stretched out. 2. Bring one knee to your chest. 3. Hold your knee to your chest by grabbing your knee or thigh. 4. Pull on your knee until you feel a gentle stretch in your lower back. 5. Keep doing the stretch for 10-30 seconds. 6. Slowly let go of your leg and straighten it.  Pelvic Tilt  Do these steps 5-10 times in a row: 1. Lie on your back on a firm bed or the floor with your legs stretched out. 2. Bend your knees so they point up to the ceiling. Your feet should be flat on the floor. 3. Tighten your lower belly (abdomen) muscles to press your lower back against the floor. This will make your tailbone point up to the ceiling instead of pointing down to your feet or the floor. 4. Stay in this position for 5-10 seconds while you gently tighten your muscles and breathe evenly.  Cat-Cow  Do these steps until your lower back bends more easily: 1. Get on your hands and knees on a firm surface. Keep your hands under your shoulders, and keep your knees under your hips. You may put padding under your knees. 2. Let your  head hang down, and make your tailbone point down to the floor so your lower back is round like the back of a cat. 3. Stay in this position for 5 seconds. 4. Slowly lift your head and make your tailbone point up to the ceiling so your back hangs low (sags) like the back of a cow. 5. Stay in this position for 5 seconds.  Press-Ups  Do these steps 5-10 times in a row: 1. Lie on your belly (face-down) on the floor. 2. Place your hands near your head, about shoulder-width apart. 3. While you keep your back relaxed and keep your hips on the floor, slowly straighten your arms to raise the top half of your body and lift your shoulders. Do not use your back muscles. To make yourself more comfortable, you may change where you place your hands. 4. Stay in this position for 5 seconds. 5. Slowly return to lying flat on the floor.  Bridges  Do these steps 10 times in a row: 1. Lie on your back on a firm surface. 2. Bend your knees so they point up to the ceiling. Your feet should be flat on the floor. 3. Tighten your butt muscles and lift your butt off of the floor until your waist  is almost as high as your knees. If you do not feel the muscles working in your butt and the back of your thighs, slide your feet 1-2 inches farther away from your butt. 4. Stay in this position for 3-5 seconds. 5. Slowly lower your butt to the floor, and let your butt muscles relax.  If this exercise is too easy, try doing it with your arms crossed over your chest. Belly Crunches  Do these steps 5-10 times in a row: 1. Lie on your back on a firm bed or the floor with your legs stretched out. 2. Bend your knees so they point up to the ceiling. Your feet should be flat on the floor. 3. Cross your arms over your chest. 4. Tip your chin a little bit toward your chest but do not bend your neck. 5. Tighten your belly muscles and slowly raise your chest just enough to lift your shoulder blades a tiny bit off of the  floor. 6. Slowly lower your chest and your head to the floor.  Back Lifts Do these steps 5-10 times in a row: 1. Lie on your belly (face-down) with your arms at your sides, and rest your forehead on the floor. 2. Tighten the muscles in your legs and your butt. 3. Slowly lift your chest off of the floor while you keep your hips on the floor. Keep the back of your head in line with the curve in your back. Look at the floor while you do this. 4. Stay in this position for 3-5 seconds. 5. Slowly lower your chest and your face to the floor.  Contact a doctor if:  Your back pain gets a lot worse when you do an exercise.  Your back pain does not lessen 2 hours after you exercise. If you have any of these problems, stop doing the exercises. Do not do them again unless your doctor says it is okay. Get help right away if:  You have sudden, very bad back pain. If this happens, stop doing the exercises. Do not do them again unless your doctor says it is okay. This information is not intended to replace advice given to you by your health care provider. Make sure you discuss any questions you have with your health care provider. Document Released: 03/25/2010 Document Revised: 07/29/2015 Document Reviewed: 04/16/2014 Elsevier Interactive Patient Education  [employer]2018 Elsevier Inc.

## 2017-01-11 LAB — FECAL LACTOFERRIN, QUANT
FECAL LACTOFERRIN: NEGATIVE
MICRO NUMBER: 81253381
SPECIMEN QUALITY: ADEQUATE

## 2017-01-11 NOTE — Assessment & Plan Note (Addendum)
  Multiple sx: I asked the patient what is the sx that bothers him the most and he said back pain and  the icy-cold feeling he has  Recent normal labs including RPR, hep C, HIV being negative which is reassuring. Back pain: Stretching, meloxicam, Flexeril.  Consider referral to sports medicine Dysesthesias: Icy-hot feeling is of unclear etiology ; recent RPR, hep C and HIV negative.  Will check B12, folic acid, sed rate (they were wnl, pt called and aware).  Consider further eval with neurology. Nausea, daily diarrhea: Started before he started SSRIs.  We will check for C. difficile, culture and WBCs in the stool Also, requests  studies for diseases like [place]unya and [place] because he was in [place] and is not feeling well. He could have been exposed to [place] given visit to [place], has reportedly low-grade fever, rash, arthralgias and GI symptoms but  there is no specific treatment if he comes back + and he does not look ill.  Chikungunya is unlikely as he has no intense or severe sx. In the context of a constellations of symptoms, particularly anxiety, I do not think testing is indicated.  he nevertheless is quite adamant about being checked.  We will consider an ID referral as I typically do not check for those type of illnesses. Other infections might need to be considered, brucellosis, etc RTC as rec ~ 3 weeks

## 2017-01-12 ENCOUNTER — Encounter: Payer: Self-pay | Admitting: Internal Medicine

## 2017-01-12 LAB — CLOSTRIDIUM DIFFICILE BY PCR

## 2017-01-14 LAB — STOOL CULTURE
MICRO NUMBER: 81252535
MICRO NUMBER: 81252536
MICRO NUMBER:: 81252537
SHIGA RESULT:: NOT DETECTED
SPECIMEN QUALITY: ADEQUATE
SPECIMEN QUALITY:: ADEQUATE
SPECIMEN QUALITY:: ADEQUATE

## 2017-01-15 ENCOUNTER — Telehealth: Payer: Self-pay | Admitting: Internal Medicine

## 2017-01-15 ENCOUNTER — Encounter: Payer: Self-pay | Admitting: Internal Medicine

## 2017-01-15 NOTE — Telephone Encounter (Signed)
Copied from CRM [id number]. Topic: General - Other >> Jan 15, 2017  1:12 PM Elliot GaultBell, Tiffany M wrote: Relation to pt: self  Call back number: Pharmacy:  Reason for call:  Patient checking on the status of lab results, please advise

## 2017-01-15 NOTE — Telephone Encounter (Signed)
Results completed today.

## 2017-01-15 NOTE — Telephone Encounter (Signed)
Please advise 

## 2017-01-30 ENCOUNTER — Encounter: Payer: Commercial Managed Care - HMO | Admitting: Internal Medicine

## 2017-01-31 ENCOUNTER — Other Ambulatory Visit: Payer: Self-pay | Admitting: Medical

## 2017-01-31 ENCOUNTER — Ambulatory Visit (INDEPENDENT_AMBULATORY_CARE_PROVIDER_SITE_OTHER): Payer: 59 | Admitting: Internal Medicine

## 2017-01-31 ENCOUNTER — Encounter: Payer: Self-pay | Admitting: Internal Medicine

## 2017-01-31 VITALS — BP 116/72 | HR 108 | Temp 97.9°F | Resp 14 | Ht 65.0 in | Wt 155.1 lb

## 2017-01-31 DIAGNOSIS — R208 Other disturbances of skin sensation: Secondary | ICD-10-CM | POA: Diagnosis not present

## 2017-01-31 DIAGNOSIS — R51 Headache: Secondary | ICD-10-CM

## 2017-01-31 DIAGNOSIS — F411 Generalized anxiety disorder: Secondary | ICD-10-CM | POA: Diagnosis not present

## 2017-01-31 DIAGNOSIS — Z114 Encounter for screening for human immunodeficiency virus [HIV]: Secondary | ICD-10-CM

## 2017-01-31 DIAGNOSIS — R519 Headache, unspecified: Secondary | ICD-10-CM

## 2017-01-31 DIAGNOSIS — R197 Diarrhea, unspecified: Secondary | ICD-10-CM | POA: Diagnosis not present

## 2017-01-31 NOTE — Progress Notes (Signed)
Subjective:    Patient ID: Frank Hancock, male    DOB: 1985/12/06, 31 y.o.   MRN: [id number]  DOS:  01/31/2017 Type of visit - description : f/u Interval history: Since last time with multiple symptoms and anxiety. - "Icy hot feeling in the back" like a burning: That has decreased but is not completely well - Diarrhea: Resolved - Back pain: Resolved, meloxicam, does not need a refill. -Anxiety: Severe, started Lexapro, feels better.  Now has other multiple symptoms: 2-week history of neck pain that radiates up to the right side of the head, on and off, last few hours, partial response to Excedrin.  Occasionally associated with nausea but no vomiting. Denies fever or chills.  No head injury. I asked if this was the worst headache of his life and he said yes.  Dizziness on and off also for the last 2 weeks associated with a headache sometimes.  Symptoms usually random . No associated LOC, chest pain, S OB or palpitations.  Has randoms aches and pains "like a burn", under the jawline bilaterally, in the left arm, suprapubic area.  No associated with rash, fever or chills.   Review of Systems  Other than above, a 14 point review of systems is negative    Past Medical History:  Diagnosis Date  . Chest pain   . Frequent headaches   . H/O syncope    pre- syncope, no LOC  . History of palpitations   . Hyperlipidemia   . Hypertension         Past Surgical History:  Procedure Laterality Date  . CHEST SURGERY  2001   cosmetic, breast reduction    Social History   Socioeconomic History  . Marital status: Single    Spouse name: Not on file  . Number of children: 0  . Years of education: Not on file  . Highest education level: Not on file  Social Needs  . Financial resource strain: Not on file  . Food insecurity - worry: Not on file  . Food insecurity - inability: Not on file  . Transportation needs - medical: Not on file  . Transportation needs - non-medical: Not on  file  Occupational History  . Occupation: Insurance underwritercomputer tech - [employer]    Comment: Computer  Tobacco Use  . Smoking status: Never Smoker  . Smokeless tobacco: Never Used  Substance and Sexual Activity  . Alcohol use: Yes    Alcohol/week: 0.0 oz    Comment: Occasional  . Drug use: No  . Sexual activity: Not on file  Other Topics Concern  . Not on file  Social History Narrative   Born in [place]   Moved from [place] 2016, lives w/ mother       Allergies as of 01/31/2017   No Known Allergies     Medication List        Accurate as of 01/31/17  5:07 PM. Always use your most recent med list.          cyclobenzaprine 10 MG tablet Commonly known as:  FLEXERIL Take 1 tablet (10 mg total) at bedtime as needed by mouth for muscle spasms.   escitalopram 10 MG tablet Commonly known as:  LEXAPRO Take 1 tablet (10 mg total) by mouth daily.   EXCEDRIN PO Take 1 tablet by mouth as needed (Headaches).   LORazepam 1 MG tablet Commonly known as:  ATIVAN Take 1 mg by mouth at bedtime as needed.  Objective:   Physical Exam BP 116/72 (BP Location: Left Arm, Patient Position: Sitting, Cuff Size: Small)   Pulse (!) 108   Temp 97.9 F (36.6 C) (Oral)   Resp 14   Ht 5\' 5"  (1.651 m)   Wt 155 lb 2 oz (70.4 kg)   SpO2 98%   BMI 25.81 kg/m  General:   Well developed, well nourished . NAD.  HEENT:  Normocephalic . Face symmetric, atraumatic.  EOMI, pupils equal and reactive Neck: No TTP of the cervical spine.  Range of motion normal Lungs:  CTA B Normal respiratory effort, no intercostal retractions, no accessory muscle use. Heart: RRR,  no murmur.  No pretibial edema bilaterally  Skin: Not pale. Not jaundice Neurologic:  alert & oriented X3.  Speech normal, gait appropriate for age and unassisted.  Motor DTRs symmetric Psych--  Cognition and judgment appear intact.  Cooperative with normal attention span and concentration.  Behavior appropriate. No anxious  or depressed appearing.      Assessment & Plan:     Assessment (new pt 09-2015) HTN dx at a UC ~ 08-2015 rx atenolol HAs, frequent, Excedrin  Chest pain, palpitations, h/o pre-syncope:  --Saw cards Dr Jens Somrenshaw 06-24-14  - d-dimer/echo wnl;  Rx Holter monitor not done --Saw Dr Beverely Paceheek 09-27-15 >>>  holter-treadmill --WNL  PLAN: Multiple symptoms: Back pain: Resolved Dysesthesias: Getting better Diarrhea: Resolved Anxiety: Severe, mostly related to a question of HIV exposure, started Lexapro 01/04/2017, improved.  D/W increase Lexapro dose but he declined, states he is doing okay Also, request to check another HIV test to "settle the issue".  Will do.  Request further tests like mononucleosis etc. Rec no further workup at Headache, neck pain, dizziness: Reports this is "worse headache of her life".  We will get a CT head to be sure.  Otherwise recommend observation, occasional Excedrin.  RTC 3-4 months

## 2017-01-31 NOTE — Progress Notes (Signed)
Pre visit review using our clinic review tool, if applicable. No additional management support is needed unless otherwise documented below in the visit note. 

## 2017-01-31 NOTE — Assessment & Plan Note (Addendum)
Multiple symptoms: Back pain: Resolved Dysesthesias: Getting better Diarrhea: Resolved Anxiety: Severe, mostly related to a question of HIV exposure, started Lexapro 01/04/2017, improved.  D/W increase Lexapro dose but he declined, states he is doing okay Also, request to check another HIV test to "settle the issue".  Will do.  Request further tests like mononucleosis etc. Rec no further workup at Headache, neck pain, dizziness: Reports this is "worse headache of her life".  We will get a CT head to be sure.  Otherwise recommend observation, occasional Excedrin.  RTC 3-4 months

## 2017-01-31 NOTE — Patient Instructions (Signed)
Stop by the lab   GO TO THE FRONT DESK Schedule your next appointment for a checkup in 3 or 4 months    STOP BY THE FIRST FLOOR to see about the CAT scan  Continue with Lexapro, call for refills when needed

## 2017-02-01 ENCOUNTER — Ambulatory Visit (HOSPITAL_BASED_OUTPATIENT_CLINIC_OR_DEPARTMENT_OTHER)
Admission: RE | Admit: 2017-02-01 | Discharge: 2017-02-01 | Disposition: A | Discharge status: Home / Self Care | Payer: 59 | Source: Ambulatory Visit | Attending: Internal Medicine | Admitting: Internal Medicine

## 2017-02-01 DIAGNOSIS — R51 Headache: Secondary | ICD-10-CM | POA: Insufficient documentation

## 2017-02-01 LAB — HIV ANTIBODY (ROUTINE TESTING W REFLEX): HIV: NONREACTIVE

## 2017-02-02 ENCOUNTER — Encounter: Payer: 59 | Admitting: Internal Medicine

## 2017-02-05 ENCOUNTER — Ambulatory Visit: Payer: 59 | Admitting: Internal Medicine

## 2017-02-13 ENCOUNTER — Encounter: Payer: Self-pay | Admitting: Internal Medicine

## 2017-03-07 ENCOUNTER — Ambulatory Visit: Payer: Self-pay | Admitting: Internal Medicine

## 2017-04-16 ENCOUNTER — Ambulatory Visit: Payer: Self-pay | Admitting: Internal Medicine

## 2017-04-19 ENCOUNTER — Ambulatory Visit (INDEPENDENT_AMBULATORY_CARE_PROVIDER_SITE_OTHER): Payer: 59 | Admitting: Internal Medicine

## 2017-04-19 ENCOUNTER — Encounter: Payer: Self-pay | Admitting: Internal Medicine

## 2017-04-19 VITALS — BP 124/76 | HR 104 | Temp 97.6°F | Resp 14 | Ht 65.0 in | Wt 173.4 lb

## 2017-04-19 DIAGNOSIS — R519 Headache, unspecified: Secondary | ICD-10-CM

## 2017-04-19 DIAGNOSIS — R51 Headache: Secondary | ICD-10-CM | POA: Diagnosis not present

## 2017-04-19 MED ORDER — SUMATRIPTAN SUCCINATE 50 MG PO TABS: ORAL_TABLET | ORAL | 0 refills | Status: DC

## 2017-04-19 MED ORDER — PREDNISONE 10 MG PO TABS: ORAL_TABLET | ORAL | 0 refills | Status: DC

## 2017-04-19 MED ORDER — CYCLOBENZAPRINE HCL 10 MG PO TABS: 10.0000 mg | ORAL_TABLET | Freq: Two times a day (BID) | ORAL | 0 refills | Status: DC | PRN

## 2017-04-19 NOTE — Progress Notes (Signed)
Pre visit review using our clinic review tool, if applicable. No additional management support is needed unless otherwise documented below in the visit note. 

## 2017-04-19 NOTE — Progress Notes (Signed)
Subjective:    Patient ID: Frank Hancock, male    DOB: 09-03-1985, 32 y.o.   MRN: [id number]  DOS:  04/19/2017 Type of visit - description : Acute visit Interval history: Chief complaint is persistent headache for 2 weeks, headache is steady and gets severe on and off throughout the day. It started at the posterior right neck and moves over the head. When the headache is severe, his right eye seems watery. Since the headache is started, is taking Excedrin several times a day, initially helped some but now seems to be helping less. He is a sleeping okay.  Review of Systems Denies fever chills. Mild nausea when the headache is severe.  No vomiting. No visual disturbances except for the right eye to be watery. Denies any rash, dizziness, paresthesias. No TMJ pain. No recent dental pain or dental work. Does not feel that he is having a cold or a URI; he does have some nasal congestion.   Past Medical History:  Diagnosis Date  . Chest pain   . Frequent headaches   . H/O syncope    pre- syncope, no LOC  . History of palpitations   . Hyperlipidemia   . Hypertension         Past Surgical History:  Procedure Laterality Date  . CHEST SURGERY  2001   cosmetic, breast reduction    Social History   Socioeconomic History  . Marital status: Single    Spouse name: Not on file  . Number of children: 0  . Years of education: Not on file  . Highest education level: Not on file  Social Needs  . Financial resource strain: Not on file  . Food insecurity - worry: Not on file  . Food insecurity - inability: Not on file  . Transportation needs - medical: Not on file  . Transportation needs - non-medical: Not on file  Occupational History  . Occupation: Insurance underwritercomputer tech - [employer]    Comment: Computer  Tobacco Use  . Smoking status: Never Smoker  . Smokeless tobacco: Never Used  Substance and Sexual Activity  . Alcohol use: Yes    Alcohol/week: 0.0 oz    Comment: Occasional  .  Drug use: No  . Sexual activity: Not on file  Other Topics Concern  . Not on file  Social History Narrative   Born in [place]   Moved from [place] 2016, lives w/ mother       Allergies as of 04/19/2017   No Known Allergies     Medication List        Accurate as of 04/19/17 11:59 PM. Always use your most recent med list.          cyclobenzaprine 10 MG tablet Commonly known as:  FLEXERIL Take 1 tablet (10 mg total) by mouth 2 (two) times daily as needed for muscle spasms.   escitalopram 10 MG tablet Commonly known as:  LEXAPRO Take 1 tablet (10 mg total) by mouth daily.   EXCEDRIN PO Take 1 tablet by mouth as needed (Headaches).   LORazepam 1 MG tablet Commonly known as:  ATIVAN Take 1 mg by mouth at bedtime as needed.   predniSONE 10 MG tablet Commonly known as:  DELTASONE 4 tablets x 2 days, 3 tabs x 2 days, 2 tabs x 2 days, 1 tab x 2 days   SUMAtriptan 50 MG tablet Commonly known as:  IMITREX Take 1 tablet vevery 2 hours if the headache persists. Do not take  more than 3 tablets in a 24-hour.          Objective:   Physical Exam BP 124/76 (BP Location: Left Arm, Patient Position: Sitting, Cuff Size: Small)   Pulse (!) 104   Temp 97.6 F (36.4 C) (Oral)   Resp 14   Ht 5\' 5"  (1.651 m)   Wt 173 lb 6 oz (78.6 kg)   SpO2 96%   BMI 28.85 kg/m  General:   Well developed, well nourished . NAD.  HEENT:  Normocephalic . Face symmetric, atraumatic. TMs normal, nose is slightly congested, sinuses no TTP, throat normal. EOMI, pupils equal and reactive, conjunctivas not injected  neck: No TTP of the cervical spine, range of motion normal. TMJ: No click.  No pain. Lungs:  CTA B Normal respiratory effort, no intercostal retractions, no accessory muscle use. Heart: RRR,  no murmur.  No pretibial edema bilaterally  Skin: Not pale. Not jaundice Neurologic:  alert & oriented X3.  Speech normal, gait appropriate for age and unassisted.  Motor and DTR  symmetric Psych--  Cognition and judgment appear intact.  Cooperative with normal attention span and concentration.  Behavior appropriate. No anxious or depressed appearing.      Assessment & Plan:   Assessment (new pt 09-2015) HTN dx at a UC ~ 08-2015 rx atenolol HAs, frequent, Excedrin  Chest pain, palpitations, h/o pre-syncope:  --Saw cards Dr Jens Som 06-24-14  - d-dimer/echo wnl;  Rx Holter monitor not done --Saw Dr Beverely Pace 09-27-15 >>>  holter-treadmill --WNL  PLAN: Headache: Right-sided neck pain and headache, persistent, occasionally associated with R eye tearing. DDX is large but includes cervicogenic headache, cluster headache, migraine.  Other etiologies are less likely (TMJ, rebound HA?).  Had a severe headache few weeks ago although the headache was somewhat different compared to today's;  at that time a CT of the head was negative.  Plan: Prednisone for a few days, stop Excedrin, restart Flexeril twice a day, Imitrex as needed.  See instructions. Call if not gradually improving in the next few days. ER if severe symptoms.

## 2017-04-19 NOTE — Patient Instructions (Addendum)
Stop Excedrin  Start prednisone  Take Flexeril twice a day until better, watch for drowsiness  Starting tomorrow if the headache is severe take Imitrex.  Call if no gradually improving in the next few days  ER if severe headache, nausea, vomiting, visual problems.  See you in March.  Sooner if needed

## 2017-04-20 NOTE — Assessment & Plan Note (Signed)
Headache: Right-sided neck pain and headache, persistent, occasionally associated with R eye tearing. DDX is large but includes cervicogenic headache, cluster headache, migraine.  Other etiologies are less likely (TMJ, rebound HA?).  Had a severe headache few weeks ago although the headache was somewhat different compared to today's;  at that time a CT of the head was negative.  Plan: Prednisone for a few days, stop Excedrin, restart Flexeril twice a day, Imitrex as needed.  See instructions. Call if not gradually improving in the next few days. ER if severe symptoms.

## 2017-05-21 ENCOUNTER — Ambulatory Visit: Payer: 59 | Admitting: Internal Medicine

## 2017-05-31 ENCOUNTER — Encounter: Payer: Self-pay | Admitting: Internal Medicine

## 2017-05-31 ENCOUNTER — Ambulatory Visit (INDEPENDENT_AMBULATORY_CARE_PROVIDER_SITE_OTHER): Payer: 59 | Admitting: Internal Medicine

## 2017-05-31 VITALS — BP 132/70 | HR 103 | Temp 98.1°F | Resp 14 | Ht 65.0 in | Wt 175.5 lb

## 2017-05-31 DIAGNOSIS — R519 Headache, unspecified: Secondary | ICD-10-CM

## 2017-05-31 DIAGNOSIS — R51 Headache: Secondary | ICD-10-CM | POA: Diagnosis not present

## 2017-05-31 DIAGNOSIS — M5481 Occipital neuralgia: Secondary | ICD-10-CM | POA: Diagnosis not present

## 2017-05-31 MED ORDER — PREDNISONE 20 MG PO TABS: 20.0000 mg | ORAL_TABLET | Freq: Every day | ORAL | 0 refills | Status: DC

## 2017-05-31 MED ORDER — CYCLOBENZAPRINE HCL 10 MG PO TABS: 10.0000 mg | ORAL_TABLET | Freq: Every evening | ORAL | 0 refills | Status: DC | PRN

## 2017-05-31 MED ORDER — MELOXICAM 15 MG PO TABS: 15.0000 mg | ORAL_TABLET | Freq: Every day | ORAL | 0 refills | Status: DC | PRN

## 2017-05-31 NOTE — Progress Notes (Signed)
Pre visit review using our clinic review tool, if applicable. No additional management support is needed unless otherwise documented below in the visit note. 

## 2017-05-31 NOTE — Progress Notes (Signed)
Subjective:    Patient ID: Frank Hancock, male    DOB: 1985/10/11, 32 y.o.   MRN: [id number]  DOS:  05/31/2017 Type of visit - description : acute Interval history: Since the last office visit, he took prednisone and Flexeril.  Reports that prednisone helped his pain significantly.  Some help from Flexeril?. Again, the pain is almost a steady w/ on-off exacerbations; radiates from the right side to the neck upwards. If he puts pressure in the area he might make it feel a little better. When he has severe headaches, he may feel slightly nauseous and feeling in a daze   Review of Systems Denies paresthesias of the upper or lower extremities No gait abnormalities.  No motor deficits  Past Medical History:  Diagnosis Date  . Chest pain   . Frequent headaches   . H/O syncope    pre- syncope, no LOC  . History of palpitations   . Hyperlipidemia   . Hypertension         Past Surgical History:  Procedure Laterality Date  . CHEST SURGERY  2001   cosmetic, breast reduction    Social History   Socioeconomic History  . Marital status: Single    Spouse name: Not on file  . Number of children: 0  . Years of education: Not on file  . Highest education level: Not on file  Occupational History  . 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 Financial resource strain: Not on file  . Food insecurity:    Worry: Not on file    Inability: Not on file  . Transportation needs:    Medical: Not on file    Non-medical: Not on file  Tobacco Use  . Smoking status: Never Smoker  . Smokeless tobacco: Never Used  Substance and Sexual Activity  . Alcohol use: Yes    Alcohol/week: 0.0 oz    Comment: Occasional  . Drug use: No  . Sexual activity: Not on file  Lifestyle  . Physical activity:    Days per week: Not on file    Minutes per session: Not on file  . Stress: Not on file  Relationships  . Social connections:    Talks on phone: Not on file   Gets together: Not on file    Attends religious service: Not on file    Active member of club or organization: Not on file    Attends meetings of clubs or organizations: Not on file    Relationship status: Not on file  . Intimate partner violence:    Fear of current or ex partner: Not on file    Emotionally abused: Not on file    Physically abused: Not on file    Forced sexual activity: Not on file  Other Topics Concern  . Not on file  Social History Narrative   Born in [place]   Moved from [place] 2016, lives w/ mother       Allergies as of 05/31/2017   No Known Allergies     Medication List        Accurate as of 05/31/17 11:59 PM. Always use your most recent med list.          cyclobenzaprine 10 MG tablet Commonly known as:  FLEXERIL Take 1 tablet (10 mg total) by mouth at bedtime as needed for muscle spasms.   escitalopram 10 MG tablet Commonly known as:  LEXAPRO Take 1 tablet (10  mg total) by mouth daily.   EXCEDRIN PO Take 1 tablet by mouth as needed (Headaches).   LORazepam 1 MG tablet Commonly known as:  ATIVAN Take 1 mg by mouth at bedtime as needed.   meloxicam 15 MG tablet Commonly known as:  MOBIC Take 1 tablet (15 mg total) by mouth daily as needed for pain.   predniSONE 20 MG tablet Commonly known as:  DELTASONE Take 1 tablet (20 mg total) by mouth daily with breakfast.   SUMAtriptan 50 MG tablet Commonly known as:  IMITREX Take 1 tablet vevery 2 hours if the headache persists. Do not take more than 3 tablets in a 24-hour.          Objective:   Physical Exam BP 132/70 (BP Location: Left Arm, Patient Position: Sitting, Cuff Size: Small)   Pulse (!) 103   Temp 98.1 F (36.7 C) (Oral)   Resp 14   Ht 5\' 5"  (1.651 m)   Wt 175 lb 8 oz (79.6 kg)   SpO2 98%   BMI 29.20 kg/m  General:   Well developed, well nourished . NAD.  HEENT:  Normocephalic . Face symmetric, atraumatic Neck: No TTP at the cervical spine. Range of motion seems  normal Skin: Not pale. Not jaundice Neurologic:  alert & oriented X3.  Speech normal, gait appropriate for age and unassisted.  Motor and DTRs symmetric. EOMI. Psych--  Cognition and judgment appear intact.  Cooperative with normal attention span and concentration.  Behavior appropriate. No anxious or depressed appearing.      Assessment & Plan:    Assessment (new pt 09-2015) HTN dx at a UC ~ 08-2015 rx atenolol HAs, frequent, Excedrin  Chest pain, palpitations, h/o pre-syncope:  --Saw cards Dr Jens Somrenshaw 06-24-14  - d-dimer/echo wnl;  Rx Holter monitor not done --Saw Dr Beverely Paceheek 09-27-15 >>>  holter-treadmill --WNL  PLAN:I Headache: Ongoing HA apparently originated from the right neck ; good response to steroids and question of  response to Flexeril.  Is becoming clear that this pain is cervicogenic, occipital neuralgia?. Plan:  Neurology  referral for further eval and treatment Round of prednisone, Flexeril will change   to nightly only, restart meloxicam, GI precautions discussed. Patient in agreement.

## 2017-05-31 NOTE — Patient Instructions (Signed)
Take prednisone for 5 days  Take the muscle relaxant cyclobenzaprine or Flexeril at night  Okay to take meloxicam as needed.  Always take it with food because may cause gastritis and ulcers.  If you notice nausea, stomach pain, change in the color of stools --->  Stop the medicine and let us know  Call if you have severe or different symptoms  We are referring you to neurology

## 2017-06-01 NOTE — Assessment & Plan Note (Signed)
Headache: Ongoing HA apparently originated from the right neck ; good response to steroids and question of  response to Flexeril.  Is becoming clear that this pain is cervicogenic, occipital neuralgia?. Plan:  Neurology  referral for further eval and treatment Round of prednisone, Flexeril will change   to nightly only, restart meloxicam, GI precautions discussed. Patient in agreement.

## 2017-06-04 ENCOUNTER — Ambulatory Visit (INDEPENDENT_AMBULATORY_CARE_PROVIDER_SITE_OTHER): Payer: 59 | Admitting: Neurology

## 2017-06-04 ENCOUNTER — Encounter: Payer: Self-pay | Admitting: Neurology

## 2017-06-04 VITALS — BP 128/98 | HR 112 | Ht 65.0 in | Wt 176.0 lb

## 2017-06-04 DIAGNOSIS — G4489 Other headache syndrome: Secondary | ICD-10-CM | POA: Diagnosis not present

## 2017-06-04 DIAGNOSIS — G4440 Drug-induced headache, not elsewhere classified, not intractable: Secondary | ICD-10-CM

## 2017-06-04 DIAGNOSIS — R51 Headache: Secondary | ICD-10-CM

## 2017-06-04 DIAGNOSIS — R519 Headache, unspecified: Secondary | ICD-10-CM

## 2017-06-04 MED ORDER — AMITRIPTYLINE HCL 25 MG PO TABS: ORAL_TABLET | ORAL | 5 refills | Status: DC

## 2017-06-04 NOTE — Patient Instructions (Addendum)
Please remember, common headache triggers are: sleep deprivation, dehydration, overheating, stress, hypoglycemia or skipping meals and blood sugar fluctuations, excessive pain medications or excessive alcohol use or caffeine withdrawal. Some people have food triggers such as aged cheese, orange juice or chocolate, especially dark chocolate, or MSG (monosodium glutamate). Try to avoid these headache triggers as much possible. It may be helpful to keep a headache diary to figure out what makes your headaches worse or brings them on and what alleviates them. Some people report headache onset after exercise but studies have shown that regular exercise may actually prevent headaches from coming. If you have exercise-induced headaches, please make sure that you drink plenty of fluid before and after exercising and that you do not over do it and do not overheat.  We will do a brain scan, called MRI and call you with the test results. We will have to schedule you for this on a separate date. This test requires authorization from your insurance, and we will take care of the insurance process.  Please ask family and friends or your significant other or bed partner if you snore and if so, how loud it is, and if you have breathing related issues in your sleep, such as: snorting sounds, choking sounds, pauses in your breathing or shallow breathing events. These may be symptoms of obstructive sleep apnea (OSA).   Please decrease and eliminate your daily use of antiinflammatory medication, including the excedrin migraine, as daily use can perpetuate headaches.  Also caffeine withdrawal also can make headaches flare up daily.   As discussed, for headache prevention we will start: Elavil (generic name: amitriptyline) 25 mg: Take half a pill daily at bedtime for one week, then one pill daily at bedtime for one week, then one and a half pills daily at bedtime for one week, then 2 pills daily at bedtime thereafter. Common side  effects reported are: mouth dryness, drowsiness, confusion, dizziness.   Please make sure, you have a full, dilated eye exam with your eye doctor soon.

## 2017-06-04 NOTE — Progress Notes (Signed)
Subjective:    Patient ID: Frank Hancock is a 32 y.o. male.  HPI     Huston Foley, MD, PhD [facility] [address], [address] [address] [place], [place] [address]  Dr. Drue Novel,  I saw your patient, Frank Hancock, upon your kind request in my neurologic clinic today for initial consultation of his recurrent headaches. The patient is unaccompanied today. As you know, Mr. [name] is a 32 year old right-handed gentleman with an underlying medical history of palpitations, hypertension, hyperlipidemia, history of syncope, and overweight state, who reports recurrent headaches for the past months, nearly daily for past month and a half. He has been taking Mobic daily for the past week and Excedrin Migraine twice daily for the past month and a half. He has a longer standing history of headaches and migraine headaches even when he was a teenager and has been taking Excedrin as needed once or twice a week for several years. He reports that he took a trip to [place] and when he returned he started having more sharper and different type of headache starting in the right occipital area and radiating forward. He has occasional blurry vision and right eye pain with this. He has had recent blood work through your office which I reviewed. He had a routine eye exam but not a dilated eye exam recently. He does not have any corrective eyeglasses. He is a nonsmoker and drinks alcohol rarely, he drinks caffeine in the form of coffee 2 cups per day and 4-5 sodas per day. He reports that Flexeril made him too sleepy. I reviewed your note from 05/31/2017. He had a head CT without contrast on 02/01/2017 and I reviewed the results (Indication was headache for 2 months): IMPRESSION: Unremarkable CT appearance of the brain. He is not sure if he snores, he has woken up with a headache. He lives with his mother. His Epworth sleepiness score is 6 out of 24, fatigue scores 30 out of 63.  He also reports neck pain and  having to crack his neck.  His Past Medical History Is Significant For: Past Medical History:  Diagnosis Date  . Chest pain   . Frequent headaches   . H/O syncope    pre- syncope, no LOC  . History of palpitations   . Hyperlipidemia   . Hypertension         His Past Surgical History Is Significant For: Past Surgical History:  Procedure Laterality Date  . CHEST SURGERY  2001   cosmetic, breast reduction    His Family History Is Significant For: Family History  Problem Relation Age of Onset  . Hypertension Mother   . Hypertension Sister   . Diabetes Brother   . Hypertension Brother   . Heart defect Maternal Grandmother        valve repair  . Colon cancer Neg Hx   . Prostate cancer Neg Hx   . CAD Neg Hx     His Social History Is Significant For: Social History   Socioeconomic History  . Marital status: Single    Spouse name: Not on file  . Number of children: 0  . Years of education: Not on file  . Highest education level: Not on file  Occupational History  . 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 Financial resource strain: Not on file  . Food insecurity:    Worry: Not on file    Inability: Not on file  .  Transportation needs:    Medical: Not on file    Non-medical: Not on file  Tobacco Use  . Smoking status: Never Smoker  . Smokeless tobacco: Never Used  Substance and Sexual Activity  . Alcohol use: Yes    Alcohol/week: 0.0 oz    Comment: Occasional  . Drug use: No  . Sexual activity: Not on file  Lifestyle  . Physical activity:    Days per week: Not on file    Minutes per session: Not on file  . Stress: Not on file  Relationships  . Social connections:    Talks on phone: Not on file    Gets together: Not on file    Attends religious service: Not on file    Active member of club or organization: Not on file    Attends meetings of clubs or organizations: Not on file    Relationship status: Not on file   Other Topics Concern  . Not on file  Social History Narrative   Born in [place]   Moved from [place] 2016, lives w/ mother     His Allergies Are:  No Known Allergies:   His Current Medications Are:  Outpatient Encounter Medications as of 06/04/2017  Medication Sig  . Aspirin-Acetaminophen-Caffeine (EXCEDRIN PO) Take 1 tablet by mouth as needed (Headaches).  . cyclobenzaprine (FLEXERIL) 10 MG tablet Take 1 tablet (10 mg total) by mouth at bedtime as needed for muscle spasms.  . meloxicam (MOBIC) 15 MG tablet Take 1 tablet (15 mg total) by mouth daily as needed for pain.  . predniSONE (DELTASONE) 20 MG tablet Take 1 tablet (20 mg total) by mouth daily with breakfast.  . [DISCONTINUED] escitalopram (LEXAPRO) 10 MG tablet Take 1 tablet (10 mg total) by mouth daily. (Patient not taking: Reported on 05/31/2017)  . [DISCONTINUED] LORazepam (ATIVAN) 1 MG tablet Take 1 mg by mouth at bedtime as needed.  . [DISCONTINUED] SUMAtriptan (IMITREX) 50 MG tablet Take 1 tablet vevery 2 hours if the headache persists. Do not take more than 3 tablets in a 24-hour. (Patient not taking: Reported on 05/31/2017)   No facility-administered encounter medications on file as of 06/04/2017.   :   Review of Systems:  Out of a complete 14 point review of systems, all are reviewed and negative with the exception of these symptoms as listed below:  Review of Systems  Neurological:       Pt presents today to discuss his headache. Pt has had a headache for a month. Pt has tried imitrex but it did not help. Fioricet, prednisone, flexeril, and meloxicam seem to help.    Objective:  Neurological Exam  Physical Exam Physical Examination:   Vitals:   06/04/17 1510  BP: (!) 128/98  Pulse: (!) 112    General Examination: The patient is a very pleasant 32 y.o. male in no acute distress. He appears well-developed and well-nourished and well groomed.   HEENT: Normocephalic, atraumatic, pupils are equal, round and  reactive to light and accommodation. Extraocular tracking is good without limitation to gaze excursion or nystagmus noted. Normal smooth pursuit is noted. Hearing is grossly intact. Face is symmetric with normal facial animation and normal facial sensation. Speech is clear with no dysarthria noted. There is no hypophonia. There is no lip, neck/head, jaw or voice tremor. Neck is supple with full range of passive and active motion. There are no carotid bruits on auscultation. Oropharynx exam reveals: mild mouth dryness, adequate dental hygiene and moderate airway crowding, due  to smaller airway entry, tonsils in place, longer appearing uvula and redundant soft palate. Mallampati is class II. Tongue protrudes centrally and palate elevates symmetrically. Neck circumference is 16-3/8 inches.   Chest: Clear to auscultation without wheezing, rhonchi or crackles noted.  Heart: S1+S2+0, regular and normal without murmurs, rubs or gallops noted, mildly tachycardic.   Abdomen: Soft, non-tender and non-distended with normal bowel sounds appreciated on auscultation.  Extremities: There is no pitting edema in the distal lower extremities bilaterally. Pedal pulses are intact.  Skin: Warm and dry without trophic changes noted.  Musculoskeletal: exam reveals no obvious joint deformities, tenderness or joint swelling or erythema.   Neurologically:  Mental status: The patient is awake, alert and oriented in all 4 spheres. His immediate and remote memory, attention, language skills and fund of knowledge are appropriate. There is no evidence of aphasia, agnosia, apraxia or anomia. Speech is clear with normal prosody and enunciation. Thought process is linear. Mood is normal and affect is normal.  Cranial nerves II - XII are as described above under HEENT exam. In addition: shoulder shrug is normal with equal shoulder height noted. Motor exam: Normal bulk, strength and tone is noted. There is no drift, tremor or rebound.  Romberg is negative. Reflexes are 2+ throughout. Fine motor skills and coordination: grossly intact.  Cerebellar testing: No dysmetria or intention tremor on finger to nose testing. Heel to shin is unremarkable bilaterally. There is no truncal or gait ataxia.  Sensory exam: intact to light touch in the upper and lower extremities.  Gait, station and balance: He stands easily. No veering to one side is noted. No leaning to one side is noted. Posture is age-appropriate and stance is narrow based. Gait shows normal stride length and normal pace. No problems turning are noted. Tandem walk is unremarkable.   Assessment and Plan:  In summary, Suvan Stcyr Bozzo is a very pleasant 32 y.o.-year old male with an underlying medical history of palpitations, hypertension, hyperlipidemia, history of syncope, and overweight state, who presents for neurologic consultation of his recurrent headaches. He has a longer standing history of headaches for years. He has had a more consistent daily headache in the past month or month and a half. He describes a more occipital type headache. Complicating factors or daily use all nonsteroidal anti-inflammatory medication including over-the-counter Excedrin as well as meloxicam. In addition, daily caffeine use is fairly extensive as well, possibly adding caffeine withdrawal headache to the next. His history is not telltale for recurrent migrainous headaches at this time, reassuringly, his neurological exam is nonfocal. Nevertheless, we talked about headache triggers and symptomatic management and hopefully also breaking the cycle of perpetuating headaches by avoiding and limiting caffeine and avoiding medication overuse. He is advised to reduce his caffeine intake gradually and also reduce his anti-inflammatory medication use gradually. He is encouraged to stay well-hydrated with water. We will proceed with a brain MRI with and without contrast to rule out her structural cause of his symptoms.  He is furthermore advised regarding the possibility of being tested for obstructive sleep apnea as he does have a fairly crowded looking airway. Nevertheless, he is not sure about snoring. He is encouraged to ask his friends and family about possibility of snoring and pauses in his breathing while he is asleep. We could certainly proceed with sleep study testing in the near future if the need arises. We will call him with his MRI results. He is advised to start preventative headache treatment in the form  of amitriptyline with gradual titration. We talked about potential side effects and expectations. He is advised to seek a full/dilated eye exam with his eye doctor. We will see him back in about 3 months for follow-upon, he can see one of her nurse practitioners. He is encouraged to call with any interim questions or concerns. I answered all his questions today and he was in agreement. Thank you very much for allowing me to participate in the care of this nice patient. If I can be of any further assistance to you please do not hesitate to call me at [phone].  Sincerely,   Huston FoleySaima Laurin Morgenstern, MD, PhD

## 2017-06-05 ENCOUNTER — Encounter: Payer: Self-pay | Admitting: Internal Medicine

## 2017-06-05 ENCOUNTER — Telehealth: Payer: Self-pay | Admitting: Neurology

## 2017-06-05 NOTE — Telephone Encounter (Signed)
MR Brain w/wo contrast [facility]: [id number]: [id number] (exp. 06/04/17 to 07/19/17) he is scheduled for 06/13/17 on the [facility] mobile unit.

## 2017-06-13 ENCOUNTER — Other Ambulatory Visit: Payer: 59

## 2017-06-20 ENCOUNTER — Telehealth: Payer: Self-pay

## 2017-06-20 ENCOUNTER — Ambulatory Visit: Payer: 59

## 2017-06-20 DIAGNOSIS — R519 Headache, unspecified: Secondary | ICD-10-CM

## 2017-06-20 DIAGNOSIS — G4489 Other headache syndrome: Secondary | ICD-10-CM

## 2017-06-20 DIAGNOSIS — R51 Headache: Principal | ICD-10-CM

## 2017-06-20 DIAGNOSIS — G4440 Drug-induced headache, not elsewhere classified, not intractable: Secondary | ICD-10-CM

## 2017-06-20 MED ORDER — GADOPENTETATE DIMEGLUMINE 469.01 MG/ML IV SOLN
15.0000 mL | Freq: Once | INTRAVENOUS | Status: AC | PRN
  Administered 2017-06-20: 15 mL via INTRAVENOUS

## 2017-06-20 NOTE — Telephone Encounter (Signed)
Dr. Terrace ArabiaYan reviewed pt's MRI results, asked me to call pt and let him know that it is normal.  I called pt and discussed this with him. Pt verbalized understanding of results.  Pt does report that he snores heavily. Pt would like a sleep study, as discussed with Dr. Frances FurbishAthar at his last visit. I will discuss this with Dr. Frances FurbishAthar when returns to the office. Pt verbalized understanding.

## 2017-06-25 NOTE — Addendum Note (Signed)
Addended by: Geronimo RunningINKINS, Candace Begue A on: 06/25/2017 10:21 AM   Modules accepted: Orders

## 2017-06-25 NOTE — Telephone Encounter (Signed)
Received verbal order from Dr. Frances FurbishAthar to order pt a split night study. Order placed.

## 2017-06-27 ENCOUNTER — Telehealth: Payer: Self-pay

## 2017-06-27 DIAGNOSIS — R519 Headache, unspecified: Secondary | ICD-10-CM

## 2017-06-27 DIAGNOSIS — R51 Headache: Principal | ICD-10-CM

## 2017-06-27 NOTE — Telephone Encounter (Signed)
HST order placed. 

## 2017-06-27 NOTE — Telephone Encounter (Signed)
[employer] denied in lab sleep study, need HST order 

## 2017-07-01 ENCOUNTER — Encounter: Payer: Self-pay | Admitting: Neurology

## 2017-07-02 ENCOUNTER — Telehealth: Payer: Self-pay | Admitting: Neurology

## 2017-07-02 DIAGNOSIS — R519 Headache, unspecified: Secondary | ICD-10-CM

## 2017-07-02 DIAGNOSIS — G4440 Drug-induced headache, not elsewhere classified, not intractable: Secondary | ICD-10-CM

## 2017-07-02 DIAGNOSIS — G4489 Other headache syndrome: Secondary | ICD-10-CM

## 2017-07-02 DIAGNOSIS — R51 Headache: Principal | ICD-10-CM

## 2017-07-02 MED ORDER — AMITRIPTYLINE HCL 25 MG PO TABS: 75.0000 mg | ORAL_TABLET | Freq: Every day | ORAL | 5 refills | Status: DC

## 2017-07-02 NOTE — Telephone Encounter (Signed)
Actually was on the phone with this patient when you called. Pt put me on hold to answer your call.  Insurance denied auth for in lab sleep study but approved for HST.  Tried to schedule HST with patient but he wanted to check cost with insurance first.  Pt stated that he will call us back to schedule HST.

## 2017-07-02 NOTE — Telephone Encounter (Signed)
Please review email from patient.  Please call pt back: We had talked about medication overuse and caffeine withdrawal headache as a component of his headache syndrome. Please find out if he has been able to follow those recommendations. He was advised to followup/seek consultation with ophthalmology for full/dilated eye exam, can he have those records forwarded to me?  He should be on amitriptyline 50 mg each night at this point. We can increase this to 75 mg each night if he is agreeable. We will also pursue sleep study testing. Please find out from the sleep lab where we stand on the authorization for sleep study testing.

## 2017-07-02 NOTE — Telephone Encounter (Signed)
I called pt to discuss. No answer, left a message asking him to call me back. 

## 2017-07-02 NOTE — Telephone Encounter (Signed)
Pt returned my call and I explained Dr. Teofilo Pod recommendations. Pt says that he has tried to wean himself off of the excedrin migraine but he is in unbearable pain without it. The excedrin dulls the pain. Pt reports that he only drinks water and does not consume caffeine. Pt says that the amitriptyline is not working. Pt reports that he is unsure if Dr. Frances Furbish understands that he has a history of migraines and "this is not a migraine." Pt reports that he traveled to [place], and ever since, he has had this headache, and is wondering if he could have picked up a disease from a bug bite. Pt wants to know why he cannot have an LP. I explained that once he has an ophthalmologist appt (which pt has not scheduled yet), and if swelling behind his eyes are noted, then Dr. Frances Furbish could consider an LP. However, an LP order does not seem to be indicated presently, but I assured pt that I would discuss this with Dr. Frances Furbish. Pt reports that he is willing to try and cut back on the excedrin and try the increase in amitriptyline of 75 mg  PO QHS. Pt is also wiling to undergo a sleep study and I will check the status of the sleep study with our sleep lab. Pt verbalized understanding.  I spoke with Dr. Frances Furbish regarding pt's LP request, and she recommends that I return pt's mychart message and ask him to discuss with pt's PCP a referral to infectious diseases to discuss a possible LP.

## 2017-07-03 ENCOUNTER — Encounter: Payer: Self-pay | Admitting: Internal Medicine

## 2017-07-09 ENCOUNTER — Encounter: Payer: Self-pay | Admitting: Internal Medicine

## 2017-07-12 ENCOUNTER — Ambulatory Visit: Payer: 59 | Admitting: Internal Medicine

## 2017-07-17 ENCOUNTER — Ambulatory Visit: Payer: 59 | Admitting: Internal Medicine

## 2017-07-17 ENCOUNTER — Telehealth: Payer: Self-pay

## 2017-07-17 NOTE — Telephone Encounter (Signed)
Dismissal letter printed, signed by PCP and forwarded to [place], Research officer, political party.

## 2017-07-17 NOTE — Telephone Encounter (Signed)
Multiple no show/cancellations on file:   07/17/2017- no show 07/12/2017- cancellation 05/21/2017-no show 04/16/2017- cancellation 03/07/2017- cancellation 02/05/2017- cancellation 01/30/2017-cancellation 01/10/2017-cancellation 12/21/2016- cancellation  Pt established care on 09/30/2015.   Would you like to begin dismissal process?

## 2017-07-17 NOTE — Telephone Encounter (Signed)
Please  start the dismissal process 

## 2017-07-18 ENCOUNTER — Telehealth: Payer: Self-pay | Admitting: Internal Medicine

## 2017-07-18 NOTE — Telephone Encounter (Signed)
Patient dismissed from [facility] by Willow Ora MD, effective Jul 17, 2017. Dismissal letter sent out by certified / registered mail.  daj

## 2017-07-23 ENCOUNTER — Telehealth: Payer: Self-pay | Admitting: Neurology

## 2017-07-23 NOTE — Telephone Encounter (Signed)
Pt was called on 4/29 to get scheduled for his hst. Pt stated he would call the sleep lab back after he checked with his ins. We have not heard back for the pt at this time.

## 2017-07-24 NOTE — Telephone Encounter (Signed)
Received signed domestic return receipt verifying delivery of certified letter on May 17,2019. Article number [id number]. MW

## 2017-08-01 ENCOUNTER — Telehealth: Payer: Self-pay

## 2017-08-01 NOTE — Telephone Encounter (Signed)
error 

## 2017-08-27 ENCOUNTER — Telehealth: Payer: Self-pay | Admitting: *Deleted

## 2017-08-27 NOTE — Telephone Encounter (Signed)
[facility], pt dismissed from practice 07/18/17, letter received 07/24/17; forwarded to [place] for email/scan/SLS 06/24

## 2017-09-03 ENCOUNTER — Ambulatory Visit: Payer: 59 | Admitting: Nurse Practitioner

## 2017-09-19 ENCOUNTER — Telehealth: Payer: Self-pay | Admitting: *Deleted

## 2017-09-19 NOTE — Telephone Encounter (Signed)
Received request for Medical Records from [facility]; forwarded to [place] for email/scan/SLS 07/17    

## 2017-10-04 ENCOUNTER — Ambulatory Visit (INDEPENDENT_AMBULATORY_CARE_PROVIDER_SITE_OTHER): Payer: 59 | Admitting: Pharmacist

## 2017-10-04 ENCOUNTER — Other Ambulatory Visit (HOSPITAL_COMMUNITY)
Admission: RE | Admit: 2017-10-04 | Discharge: 2017-10-04 | Disposition: A | Discharge status: Home / Self Care | Payer: 59 | Source: Ambulatory Visit | Attending: Infectious Disease | Admitting: Infectious Disease

## 2017-10-04 DIAGNOSIS — Z7251 High risk heterosexual behavior: Secondary | ICD-10-CM | POA: Diagnosis not present

## 2017-10-04 NOTE — Progress Notes (Signed)
Date:  10/04/2017   HPI: Frank Hancock is a 32 y.o. male who presents to the [facility] pharmacy clinic to discuss and initiate PrEP.   Insured   [x]    Uninsured  []    Patient Active Problem List   Diagnosis Date Noted  . Annual physical exam 12/21/2015  . PCP NOTES >>>>>>>>>>>>>>>>>>>>> 10/01/2015  . Anxiety state 10/01/2015  . Dyspnea 06/24/2014  . Chest pain 06/24/2014  . Palpitations 06/24/2014    Patient's Medications  New Prescriptions   No medications on file  Previous Medications   ASPIRIN-ACETAMINOPHEN-CAFFEINE (EXCEDRIN PO)    Take 1 tablet by mouth as needed (Headaches).  Modified Medications   No medications on file  Discontinued Medications   AMITRIPTYLINE (ELAVIL) 25 MG TABLET    Take 3 tablets (75 mg total) by mouth at bedtime.   CYCLOBENZAPRINE (FLEXERIL) 10 MG TABLET    Take 1 tablet (10 mg total) by mouth at bedtime as needed for muscle spasms.   MELOXICAM (MOBIC) 15 MG TABLET    Take 1 tablet (15 mg total) by mouth daily as needed for pain.   PREDNISONE (DELTASONE) 20 MG TABLET    Take 1 tablet (20 mg total) by mouth daily with breakfast.    Allergies: No Known Allergies  Past Medical History: Past Medical History:  Diagnosis Date  . Chest pain   . Frequent headaches   . H/O syncope    pre- syncope, no LOC  . History of palpitations   . Hyperlipidemia   . Hypertension         Social History: Social History   Socioeconomic History  . Marital status: Single    Spouse name: Not on file  . Number of children: 0  . Years of education: Not on file  . Highest education level: Not on file  Occupational History  . Occupation: Insurance underwritercomputer tech - [employer]    Comment: Computer  Social Needs  . Financial resource strain: Not on file  . Food insecurity:    Worry: Not on file    Inability: Not on file  . Transportation needs:    Medical: Not on file    Non-medical: Not on file  Tobacco Use  . Smoking status: Never Smoker  . Smokeless tobacco: Never Used   Substance and Sexual Activity  . Alcohol use: Yes    Alcohol/week: 0.0 oz    Comment: Occasional  . Drug use: No  . Sexual activity: Not on file  Lifestyle  . Physical activity:    Days per week: Not on file    Minutes per session: Not on file  . Stress: Not on file  Relationships  . Social connections:    Talks on phone: Not on file    Gets together: Not on file    Attends religious service: Not on file    Active member of club or organization: Not on file    Attends meetings of clubs or organizations: Not on file    Relationship status: Not on file  Other Topics Concern  . Not on file  Social History Narrative   Born in [place]   Moved from [place] 2016, lives w/ mother     [facility]  Insurance Status Insured  Gender at birth Male  Gender identity cis-Male  Sex Partners Women only  # sex partners past 3-6 mos 1-3  Condom use Yes  % condom use 90  Treated for STI? No  HIV  symptoms? N/A    Labs:  SCr: Lab Results  Component Value Date   CREATININE 0.85 01/04/2017   CREATININE 0.93 09/30/2015   HIV Lab Results  Component Value Date   HIV NON-REACTIVE 01/31/2017   HIV NON-REACTIVE 01/04/2017   HIV NONREACTIVE 12/21/2015   Hepatitis B No results found for: HEPBSAB, HEPBSAG, HEPBCAB Hepatitis C Lab Results  Component Value Date   HEPCAB NON-REACTIVE 01/04/2017   Hepatitis A No results found for: HAV RPR and STI Lab Results  Component Value Date   LABRPR NON-REACTIVE 01/04/2017    No flowsheet data found.  Assessment: Theotis is here today to discuss and initiate PrEP.  He found out about PrEP on the Internet and has no real baseline knowledge of what PrEP really is or does.  He would like to protect himself from acquiring HIV.  He has a fiance that is male and lives in [place] that he only sees 2 times per year. He has no other partners and they always use condoms.  He also stated that they get tested every time  when they are together.   I explained what Truvada is and how to take it. I told him that it is an everyday medication with minimal side effects after the first couple of weeks.  He is asking if he can only take it when he is in [place] with his fiance.  I told him that is not really how the medication is intended to be taken.  It is not an "on demand" medication that you take when you are at risk.  I also told him that if they use condoms, he has no other partners, and they get tested each time they are together, than his risk is VERY low anyway.  I also explained that he would have to come here every 3 months to get labs before I would refill his Truvada.  He would like to proceed for now, so I will get all baseline labs and prescribe PrEP for him.  If he decides not to take it or decides to quit, he will let me know.  Plan: - HIV antibody, Hepatitis A/B/C, BMET, RPR, urine gc/c today - Truvada for PrEP if negative - Will call with results and schedule follow-up at that time  Frank Hancock, PharmD, AAHIVP, CPP Infectious Diseases Clinical Pharmacist [facility] 10/04/2017, 3:31 PM

## 2017-10-05 LAB — BASIC METABOLIC PANEL
BUN: 13 mg/dL (ref 7–25)
CO2: 25 mmol/L (ref 20–32)
CREATININE: 0.81 mg/dL (ref 0.60–1.35)
Calcium: 9.8 mg/dL (ref 8.6–10.3)
Chloride: 105 mmol/L (ref 98–110)
GLUCOSE: 89 mg/dL (ref 65–99)
POTASSIUM: 4.1 mmol/L (ref 3.5–5.3)
Sodium: 140 mmol/L (ref 135–146)

## 2017-10-05 LAB — URINE CYTOLOGY ANCILLARY ONLY
CHLAMYDIA, DNA PROBE: NEGATIVE
NEISSERIA GONORRHEA: NEGATIVE

## 2017-10-05 LAB — HEPATITIS B SURFACE ANTIGEN: Hepatitis B Surface Ag: NONREACTIVE

## 2017-10-05 LAB — HEPATITIS B SURFACE ANTIBODY,QUALITATIVE: HEP B S AB: NONREACTIVE

## 2017-10-05 LAB — HEPATITIS A ANTIBODY, TOTAL: HEPATITIS A AB,TOTAL: REACTIVE — AB

## 2017-10-05 LAB — HEPATITIS C ANTIBODY
Hepatitis C Ab: NONREACTIVE
SIGNAL TO CUT-OFF: 0.01 (ref <1.00)

## 2017-10-05 LAB — RPR: RPR Ser Ql: NONREACTIVE

## 2017-10-05 LAB — HIV ANTIBODY (ROUTINE TESTING W REFLEX): HIV 1&2 Ab, 4th Generation: NONREACTIVE

## 2017-10-08 ENCOUNTER — Other Ambulatory Visit: Payer: Self-pay | Admitting: Pharmacist

## 2017-10-08 ENCOUNTER — Telehealth: Payer: Self-pay | Admitting: Pharmacist

## 2017-10-08 DIAGNOSIS — Z7251 High risk heterosexual behavior: Secondary | ICD-10-CM

## 2017-10-08 MED ORDER — EMTRICITABINE-TENOFOVIR DF 200-300 MG PO TABS
1.0000 | ORAL_TABLET | Freq: Every day | ORAL | 1 refills | Status: DC
Start: 2017-10-08 — End: 2017-11-12

## 2017-10-08 MED FILL — TRUVADA 200-300 MG TABS: 200-300 | 30 days supply | Qty: 30 | Fill #0

## 2017-10-08 NOTE — Telephone Encounter (Signed)
Called Frank Hancock to let him know that his HIV antibody was negative. He is traveling at the end of the month to see his fiance, so I will send him in 2 months of Truvada and have him come back and see me in September.

## 2017-10-12 ENCOUNTER — Encounter: Payer: 59 | Admitting: Internal Medicine

## 2017-11-06 MED FILL — TRUVADA 200-300 MG TABS: 200-300 | 30 days supply | Qty: 30 | Fill #1

## 2017-11-07 ENCOUNTER — Ambulatory Visit (INDEPENDENT_AMBULATORY_CARE_PROVIDER_SITE_OTHER): Payer: 59 | Admitting: Pharmacist

## 2017-11-07 ENCOUNTER — Other Ambulatory Visit (HOSPITAL_COMMUNITY)
Admission: RE | Admit: 2017-11-07 | Discharge: 2017-11-07 | Disposition: A | Discharge status: Home / Self Care | Payer: 59 | Source: Ambulatory Visit | Attending: Infectious Disease | Admitting: Infectious Disease

## 2017-11-07 DIAGNOSIS — Z23 Encounter for immunization: Secondary | ICD-10-CM | POA: Diagnosis not present

## 2017-11-07 DIAGNOSIS — Z7251 High risk heterosexual behavior: Secondary | ICD-10-CM

## 2017-11-07 NOTE — Progress Notes (Addendum)
Date:  11/07/2017   HPI: Frank Hancock is a 32 y.o. male who presents to the [facility] clinic for his 1 month PrEP follow-up.  Insured   [x]    Uninsured  []    Patient Active Problem List   Diagnosis Date Noted  . Annual physical exam 12/21/2015  . PCP NOTES >>>>>>>>>>>>>>>>>>>>> 10/01/2015  . Anxiety state 10/01/2015  . Dyspnea 06/24/2014  . Chest pain 06/24/2014  . Palpitations 06/24/2014    Patient's Medications  New Prescriptions   No medications on file  Previous Medications   ASPIRIN-ACETAMINOPHEN-CAFFEINE (EXCEDRIN PO)    Take 1 tablet by mouth as needed (Headaches).   EMTRICITABINE-TENOFOVIR (TRUVADA) 200-300 MG TABLET    Take 1 tablet by mouth daily.  Modified Medications   No medications on file  Discontinued Medications   No medications on file    Allergies: No Known Allergies  Past Medical History: Past Medical History:  Diagnosis Date  . Chest pain   . Frequent headaches   . H/O syncope    pre- syncope, no LOC  . History of palpitations   . Hyperlipidemia   . Hypertension         Social History: Social History   Socioeconomic History  . Marital status: Single    Spouse name: Not on file  . Number of children: 0  . Years of education: Not on file  . Highest education level: Not on file  Occupational History  . 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 Financial resource strain: Not on file  . Food insecurity:    Worry: Not on file    Inability: Not on file  . Transportation needs:    Medical: Not on file    Non-medical: Not on file  Tobacco Use  . Smoking status: Never Smoker  . Smokeless tobacco: Never Used  Substance and Sexual Activity  . Alcohol use: Yes    Alcohol/week: 0.0 standard drinks    Comment: Occasional  . Drug use: No  . Sexual activity: Not on file  Lifestyle  . Physical activity:    Days per week: Not on file    Minutes per session: Not on file  . Stress: Not on file    Relationships  . Social connections:    Talks on phone: Not on file    Gets together: Not on file    Attends religious service: Not on file    Active member of club or organization: Not on file    Attends meetings of clubs or organizations: Not on file    Relationship status: Not on file  Other Topics Concern  . Not on file  Social History Narrative   Born in [place]   Moved from [place] 2016, lives w/ mother     [facility] HIV PREP FLOWSHEET RESULTS 11/07/2017 10/04/2017  Insurance Status Insured Insured  Gender at birth Male Male  Gender identity cis-Male cis-Male  Risk for HIV Condomless vaginal or anal intercourse -  Sex Partners Women only Women only  # sex partners past 3-6 mos 1-3 1-3  Sex activity preferences Insertive -  Condom use Yes Yes  % condom use 90 90  Treated for STI? - No  HIV symptoms? N/A N/A    Labs:  SCr: Lab Results  Component Value Date   CREATININE 0.81 10/04/2017   CREATININE 0.85 01/04/2017   CREATININE 0.93 09/30/2015   HIV Lab Results  Component Value Date  HIV NON-REACTIVE 10/04/2017   HIV NON-REACTIVE 01/31/2017   HIV NON-REACTIVE 01/04/2017   HIV NONREACTIVE 12/21/2015   Hepatitis B Lab Results  Component Value Date   HEPBSAB NON-REACTIVE 10/04/2017   HEPBSAG NON-REACTIVE 10/04/2017   Hepatitis C Lab Results  Component Value Date   HEPCAB NON-REACTIVE 10/04/2017   Hepatitis A Lab Results  Component Value Date   HAV REACTIVE (A) 10/04/2017   RPR and STI Lab Results  Component Value Date   LABRPR NON-REACTIVE 10/04/2017   LABRPR NON-REACTIVE 01/04/2017    STI Results GC CT  10/04/2017 Negative Negative    Assessment: Frank Hancock is here today for his 1 month PrEP follow-up visit.  He started Truvada about a month ago.  He had some nausea at the beginning but states it has resolved.  He has missed no doses and is very vigilant about taking it every day.  He decided that he would take it every day and not on-demand that he was  thinking about before.  He did go to [place] and visit his fiance last month.  He developed a cough while he was there so he was prescribed ampicillin. No other acute illnesses, no fevers, or other issues.  He is monogamous with his fiance but is worried about HIV.  He would like for me to also check a HIV viral load since it has only been 2 weeks and it would be a definitive test.  I will also check his kidneys and a urine for STD testing.   He would like to start all necessary vaccines for himself today as he travels a lot and was not properly vaccinated as a child in [place]. Looks like he was previously vaccinated against Hepatitis A as he has antibodies against this. Will start HPV, Menveo, and Hepatitis B for him today.  He refused a flu shot today but will ask again next month.  Plan: - Continue Truvada PO once daily - 3 month refill if HIV negative - HIV antibody, HIV VL, urine gc/c, RPR, BMET - Menveo, Hepatitis B #1/3, HPV #1/3 today - F/u with me 10/8 at 10am for Hep B #2 and HPV #2 - F/u with me 12/2 at 1045am for PrEP f/u  Cassie L. Kuppelweiser, PharmD, AAHIVP, CPP Infectious Diseases Clinical Pharmacist [facility] 11/07/2017, 11:40 AM

## 2017-11-08 LAB — URINE CYTOLOGY ANCILLARY ONLY
Chlamydia: NEGATIVE
NEISSERIA GONORRHEA: NEGATIVE

## 2017-11-12 ENCOUNTER — Telehealth: Payer: Self-pay | Admitting: Pharmacist

## 2017-11-12 DIAGNOSIS — Z7251 High risk heterosexual behavior: Secondary | ICD-10-CM

## 2017-11-12 MED ORDER — EMTRICITABINE-TENOFOVIR DF 200-300 MG PO TABS: 1.0000 | ORAL_TABLET | Freq: Every day | ORAL | 2 refills | Status: DC

## 2017-11-12 NOTE — Telephone Encounter (Signed)
Called patient to let him know that his HIV antibody and STDs were negative.  Will send in 3 more months of Truvada.

## 2017-11-13 LAB — RPR: RPR Ser Ql: NONREACTIVE

## 2017-11-13 LAB — BASIC METABOLIC PANEL
BUN: 10 mg/dL (ref 7–25)
CHLORIDE: 103 mmol/L (ref 98–110)
CO2: 26 mmol/L (ref 20–32)
CREATININE: 0.88 mg/dL (ref 0.60–1.35)
Calcium: 10.1 mg/dL (ref 8.6–10.3)
Glucose, Bld: 92 mg/dL (ref 65–99)
Potassium: 4.1 mmol/L (ref 3.5–5.3)
SODIUM: 139 mmol/L (ref 135–146)

## 2017-11-13 LAB — HIV ANTIBODY (ROUTINE TESTING W REFLEX): HIV 1&2 Ab, 4th Generation: NONREACTIVE

## 2017-11-13 LAB — HIV-1 RNA ULTRAQUANT REFLEX TO GENTYP+
HIV 1 RNA QUANT: NOT DETECTED {copies}/mL
HIV-1 RNA QUANT, LOG: NOT DETECTED {Log_copies}/mL

## 2017-12-03 MED FILL — TRUVADA 200-300 MG TABS: 200-300 | 30 days supply | Qty: 30 | Fill #0

## 2017-12-11 ENCOUNTER — Ambulatory Visit (INDEPENDENT_AMBULATORY_CARE_PROVIDER_SITE_OTHER): Payer: 59 | Admitting: Pharmacist

## 2017-12-11 DIAGNOSIS — Z23 Encounter for immunization: Secondary | ICD-10-CM | POA: Diagnosis not present

## 2017-12-11 DIAGNOSIS — Z7251 High risk heterosexual behavior: Secondary | ICD-10-CM | POA: Diagnosis not present

## 2017-12-11 MED ORDER — EMTRICITABINE-TENOFOVIR AF 200-25 MG PO TABS: 1.0000 | ORAL_TABLET | Freq: Every day | ORAL | 2 refills | Status: DC

## 2017-12-11 MED FILL — DESCOVY 200-25 MG TABS: 200-25 | 30 days supply | Qty: 30 | Fill #0

## 2017-12-11 NOTE — Progress Notes (Signed)
Date:  12/11/2017   HPI: Frank Hancock is a 32 y.o. male presenting for HBV and HPV vaccination.  Patient Active Problem List   Diagnosis Date Noted  . Annual physical exam 12/21/2015  . PCP NOTES >>>>>>>>>>>>>>>>>>>>> 10/01/2015  . Anxiety state 10/01/2015  . Dyspnea 06/24/2014  . Chest pain 06/24/2014  . Palpitations 06/24/2014   Patient's Medications  New Prescriptions   No medications on file  Previous Medications   ASPIRIN-ACETAMINOPHEN-CAFFEINE (EXCEDRIN PO)    Take 1 tablet by mouth as needed (Headaches).   EMTRICITABINE-TENOFOVIR (TRUVADA) 200-300 MG TABLET    Take 1 tablet by mouth daily.  Modified Medications   No medications on file  Discontinued Medications   No medications on file   Allergies: No Known Allergies  Past Medical History: Past Medical History:  Diagnosis Date  . Chest pain   . Frequent headaches   . H/O syncope    pre- syncope, no LOC  . History of palpitations   . Hyperlipidemia   . Hypertension        Social History: Social History   Socioeconomic History  . Marital status: Single    Spouse name: Not on file  . Number of children: 0  . Years of education: Not on file  . Highest education level: Not on file  Occupational History  . 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 Financial resource strain: Not on file  . Food insecurity:    Worry: Not on file    Inability: Not on file  . Transportation needs:    Medical: Not on file    Non-medical: Not on file  Tobacco Use  . Smoking status: Never Smoker  . Smokeless tobacco: Never Used  Substance and Sexual Activity  . Alcohol use: Yes    Alcohol/week: 0.0 standard drinks    Comment: Occasional  . Drug use: No  . Sexual activity: Not on file  Lifestyle  . Physical activity:    Days per week: Not on file    Minutes per session: Not on file  . Stress: Not on file  Relationships  . Social connections:    Talks on phone: Not on file    Gets  together: Not on file    Attends religious service: Not on file    Active member of club or organization: Not on file    Attends meetings of clubs or organizations: Not on file    Relationship status: Not on file  Other Topics Concern  . Not on file  Social History Narrative   Born in [place]   Moved from [place] 2016, lives w/ mother    Hepatitis B Lab Results  Component Value Date   HEPBSAB NON-REACTIVE 10/04/2017   HEPBSAG NON-REACTIVE 10/04/2017   Hepatitis C Lab Results  Component Value Date   HEPCAB NON-REACTIVE 10/04/2017   Hepatitis A Lab Results  Component Value Date   HAV REACTIVE (A) 10/04/2017   Assessment: Frank Hancock is here today for his second HBV and HPV vaccinations. He also agreed to get a flu shot.  Frank Hancock is also a PrEP patient. We discussed the approval of Descovy for PrEP and he agreed to switch from Truvada to Descovy.  Plan: 2nd HBV shot 2nd HPV shot Flu shot Will send prescription for Descovy Next PrEP f/u on 12/2 @ 1045 Next HBV and HPV shots on 3/9 @ 1045  Erin N. Zigmund Daniel, PharmD PGY2 Infectious Diseases Pharmacy Resident  Phone: [phone] 12/11/2017, 10:38 AM

## 2017-12-29 IMAGING — CT CT HEAD W/O CM
3 series · 15 of 47 positions shown, 18 images · non-contrast
Comparison: Maxillofacial CT 03/24/2016

CLINICAL DATA: Headache for 2 months, beginning posteriorly and
radiating to the right side of the head. Morning nausea. Right eye
blurry vision. Dizziness.

EXAM:
CT HEAD WITHOUT CONTRAST
TECHNIQUE: Contiguous axial images were obtained from the base of the skull
through the vertex without intravenous contrast.

[Series 2: head wo · axial · 0.44mm/px · z∈[-167,-32]mm · 9 of 33 slices shown, 12 images]
[im 3/33  brain]
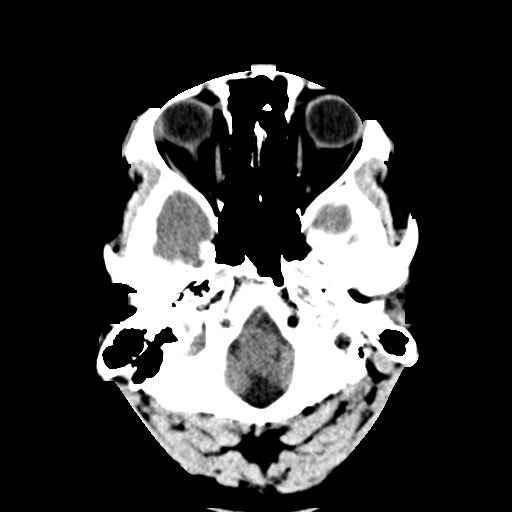
[im 3/33  bone]
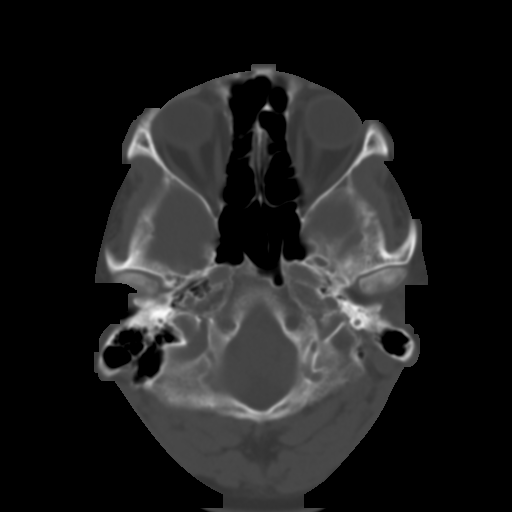
[im 6/33  brain]
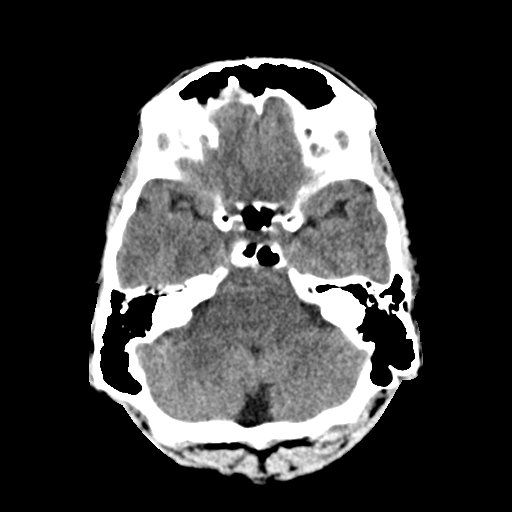
[im 9/33  brain]
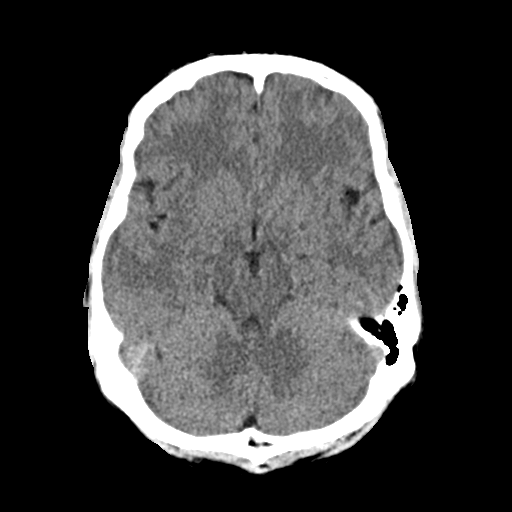
[im 13/33  brain]
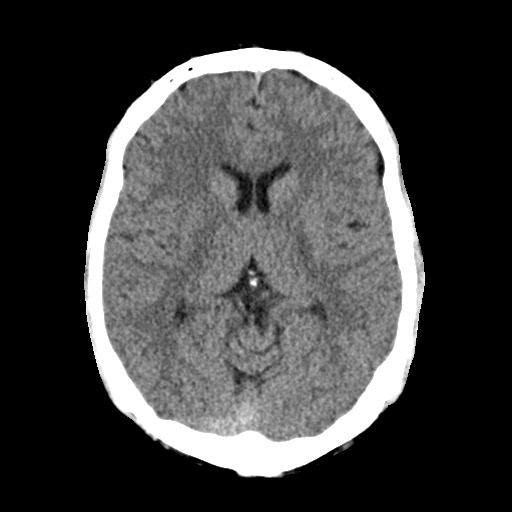
[im 17/33  brain]
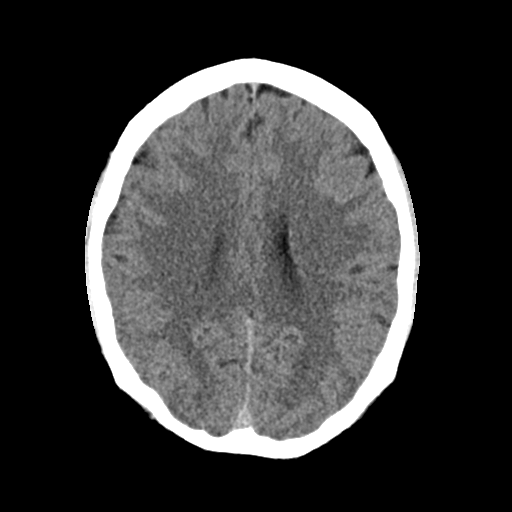
[im 17/33  bone]
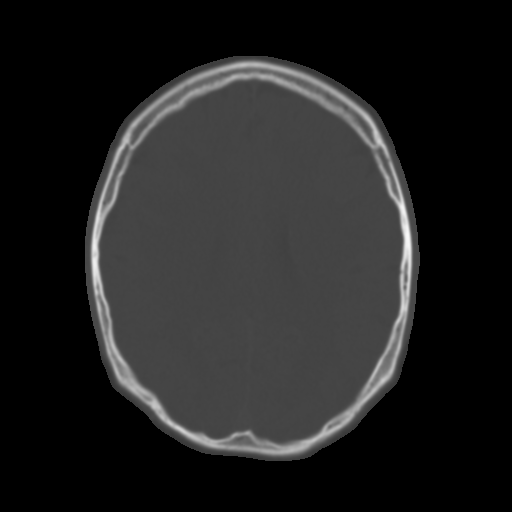
[im 20/33  brain]
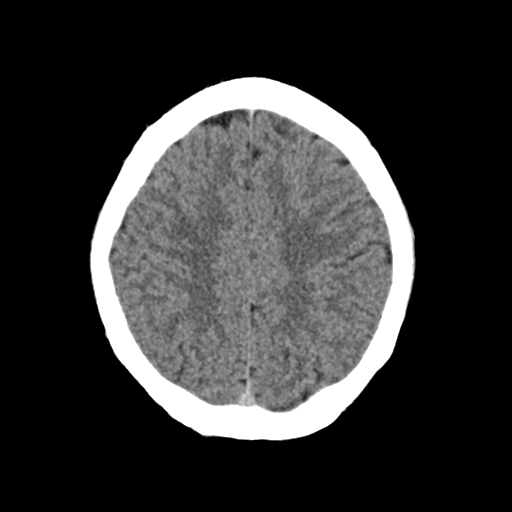
[im 24/33  brain]
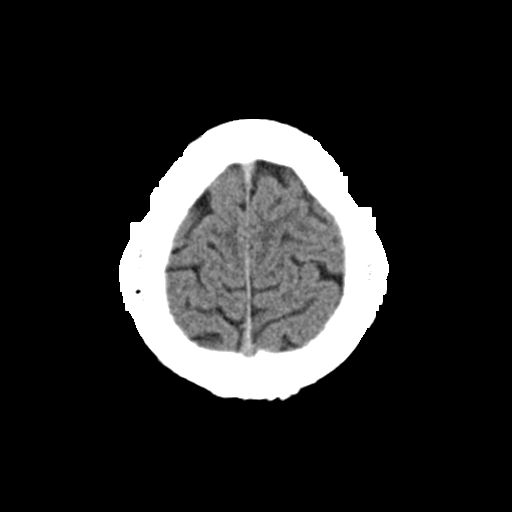
[im 27/33  brain]
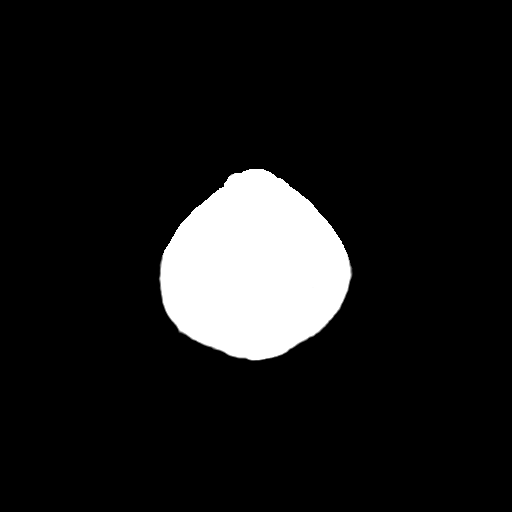
[im 30/33  brain]
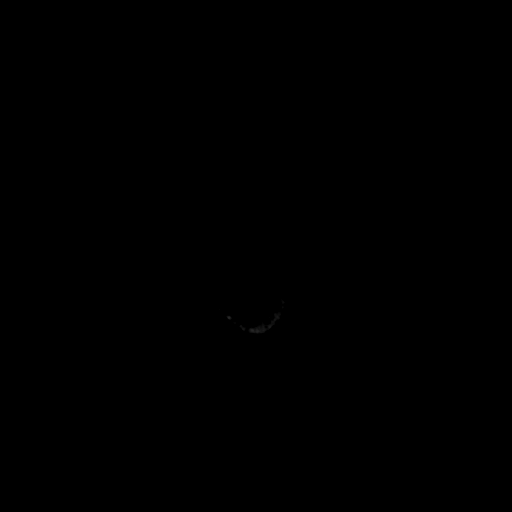
[im 30/33  bone]
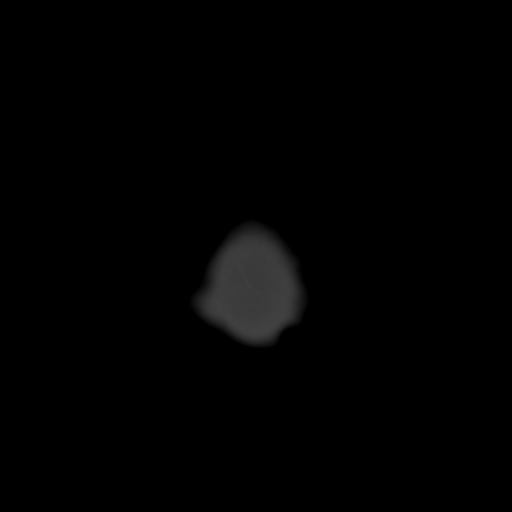

[Series 4: coronal soft · coronal · 0.32mm/px · 3 of 68 slices shown]
[im 23/68  brain]
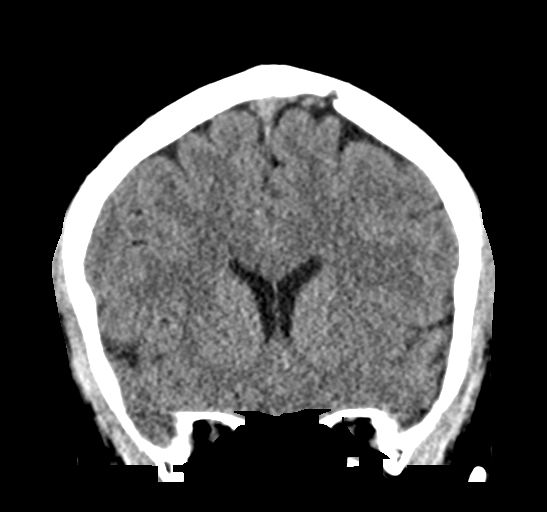
[im 30/68  brain]
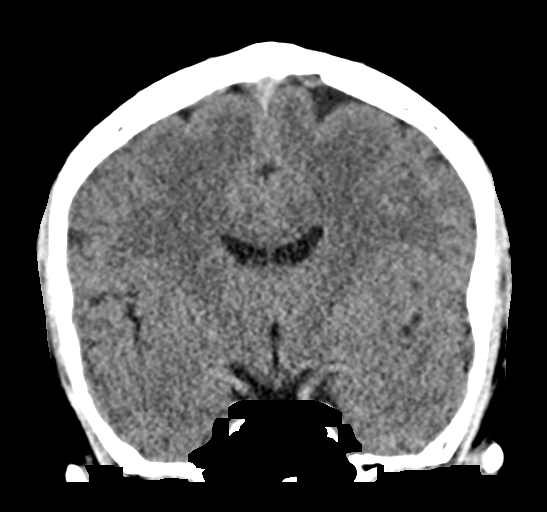
[im 38/68  brain]
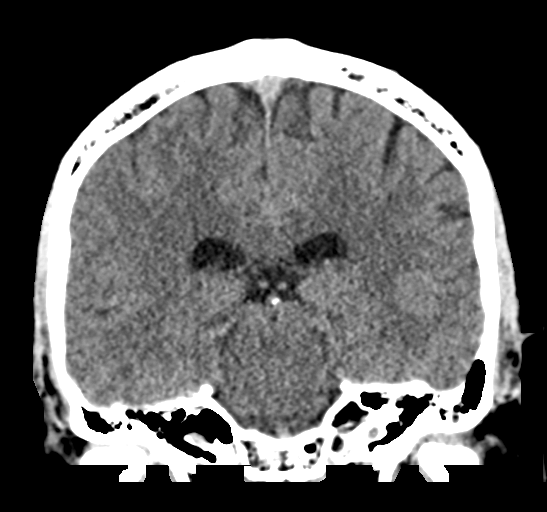

[Series 5: sag soft · sagittal · 0.32mm/px · 3 of 57 slices shown]
[im 19/57  brain]
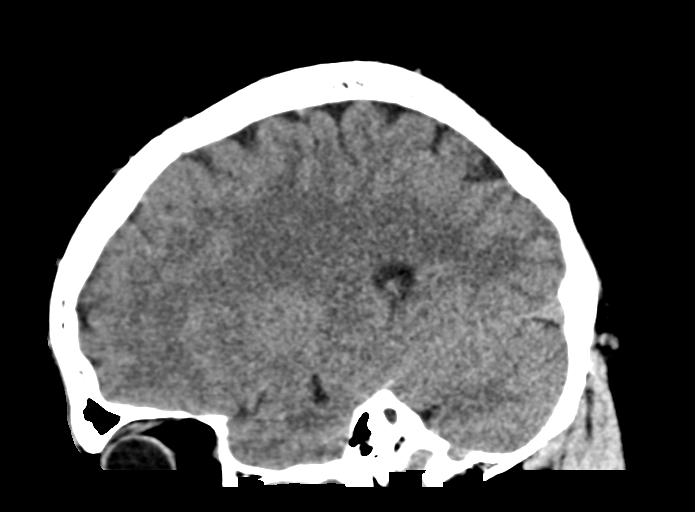
[im 29/57  brain]
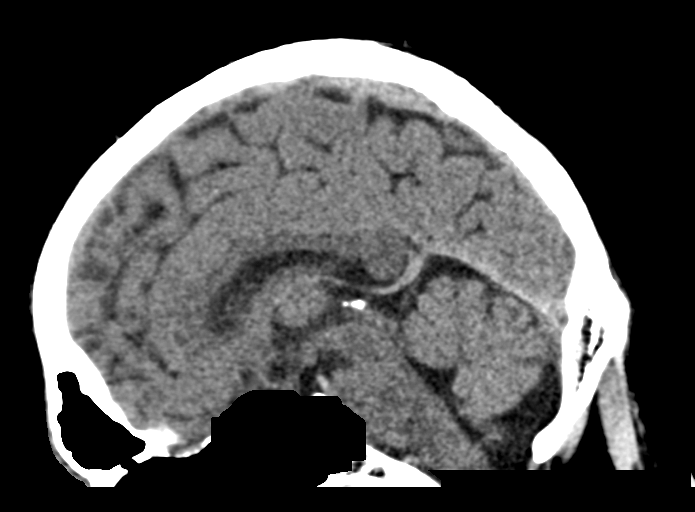
[im 38/57  brain]
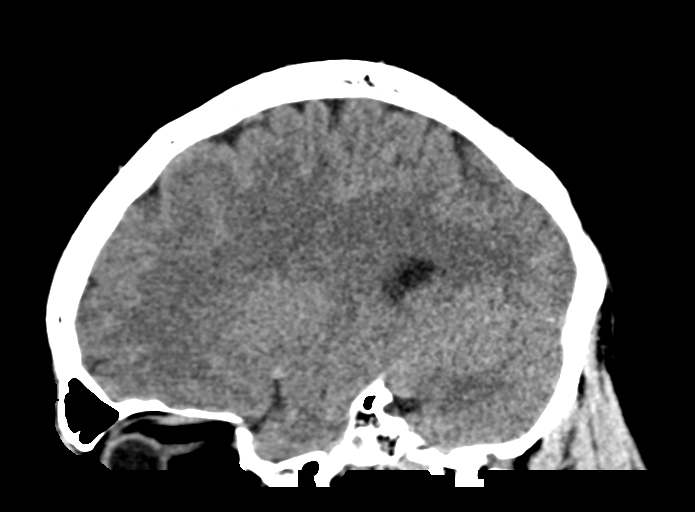

[15 of 47 positions shown; findings below may reference images not displayed]

FINDINGS: Brain: There is no evidence of acute infarct, intracranial
hemorrhage, mass, midline shift, or extra-axial fluid collection.
The ventricles and sulci are normal.

Vascular: Unremarkable.

Skull: No fracture or focal osseous lesion.

Sinuses/Orbits: Partially visualized subcentimeter mucous retention
cyst or polyp superiorly in the left maxillary sinus, grossly
unchanged. Other visualized paranasal sinuses and mastoid air cells
are clear. Visualized orbits are unremarkable.

Other: None.
IMPRESSION: Unremarkable CT appearance of the brain.

## 2018-01-08 ENCOUNTER — Ambulatory Visit: Payer: 59

## 2018-01-08 MED FILL — DESCOVY 200-25 MG TABS: 200-25 | 30 days supply | Qty: 30 | Fill #1

## 2018-02-01 MED FILL — DESCOVY 200-25 MG TABS: 200-25 | 30 days supply | Qty: 30 | Fill #2

## 2018-02-04 ENCOUNTER — Ambulatory Visit (INDEPENDENT_AMBULATORY_CARE_PROVIDER_SITE_OTHER): Payer: 59 | Admitting: Pharmacist

## 2018-02-04 DIAGNOSIS — Z7251 High risk heterosexual behavior: Secondary | ICD-10-CM

## 2018-02-04 NOTE — Progress Notes (Signed)
Date:  02/04/2018   HPI: Frank Hancock is a 32 y.o. male who presents to the [facility] clinic for his 3 month PrEP follow-up.  Insured   [x]    Uninsured  []    Patient Active Problem List   Diagnosis Date Noted  . Annual physical exam 12/21/2015  . PCP NOTES >>>>>>>>>>>>>>>>>>>>> 10/01/2015  . Anxiety state 10/01/2015  . Dyspnea 06/24/2014  . Chest pain 06/24/2014  . Palpitations 06/24/2014    Patient's Medications  New Prescriptions   No medications on file  Previous Medications   ASPIRIN-ACETAMINOPHEN-CAFFEINE (EXCEDRIN PO)    Take 1 tablet by mouth as needed (Headaches).   EMTRICITABINE-TENOFOVIR AF (DESCOVY) 200-25 MG TABLET    Take 1 tablet by mouth daily.  Modified Medications   No medications on file  Discontinued Medications   No medications on file    Allergies: No Known Allergies  Past Medical History: Past Medical History:  Diagnosis Date  . Chest pain   . Frequent headaches   . H/O syncope    pre- syncope, no LOC  . History of palpitations   . Hyperlipidemia   . Hypertension         Social History: Social History   Socioeconomic History  . Marital status: Single    Spouse name: Not on file  . Number of children: 0  . Years of education: Not on file  . Highest education level: Not on file  Occupational History  . Occupation: Insurance underwritercomputer tech - [employer]    Comment: Computer  Social Needs  . Financial resource strain: Not on file  . Food insecurity:    Worry: Not on file    Inability: Not on file  . Transportation needs:    Medical: Not on file    Non-medical: Not on file  Tobacco Use  . Smoking status: Never Smoker  . Smokeless tobacco: Never Used  Substance and Sexual Activity  . Alcohol use: Yes    Alcohol/week: 0.0 standard drinks    Comment: Occasional  . Drug use: No  . Sexual activity: Not on file  Lifestyle  . Physical activity:    Days per week: Not on file    Minutes per session: Not on file  . Stress: Not on file    Relationships  . Social connections:    Talks on phone: Not on file    Gets together: Not on file    Attends religious service: Not on file    Active member of club or organization: Not on file    Attends meetings of clubs or organizations: Not on file    Relationship status: Not on file  Other Topics Concern  . Not on file  Social History Narrative   Born in [place]   Moved from [place] 2016, lives w/ mother     [facility] HIV PREP FLOWSHEET RESULTS 02/04/2018 11/07/2017 10/04/2017  Insurance Status Insured Insured Insured  Gender at birth Male Male Male  Gender identity cis-Male cis-Male cis-Male  Risk for HIV - Condomless vaginal or anal intercourse -  Sex Partners Women only Women only Women only  # sex partners past 3-6 mos (No Data) 1-3 1-3  Sex activity preferences Insertive Insertive -  Condom use - Yes Yes  % condom use - 90 90  Treated for STI? No - No  HIV symptoms? N/A N/A N/A    Labs:  SCr: Lab Results  Component Value Date   CREATININE 0.88 11/07/2017   CREATININE 0.81  10/04/2017   CREATININE 0.85 01/04/2017   CREATININE 0.93 09/30/2015   HIV Lab Results  Component Value Date   HIV NON-REACTIVE 11/07/2017   HIV NON-REACTIVE 10/04/2017   HIV NON-REACTIVE 01/31/2017   HIV NON-REACTIVE 01/04/2017   HIV NONREACTIVE 12/21/2015   Hepatitis B Lab Results  Component Value Date   HEPBSAB NON-REACTIVE 10/04/2017   HEPBSAG NON-REACTIVE 10/04/2017   Hepatitis C Lab Results  Component Value Date   HEPCAB NON-REACTIVE 10/04/2017   Hepatitis A Lab Results  Component Value Date   HAV REACTIVE (A) 10/04/2017   RPR and STI Lab Results  Component Value Date   LABRPR NON-REACTIVE 11/07/2017   LABRPR NON-REACTIVE 10/04/2017   LABRPR NON-REACTIVE 01/04/2017    STI Results GC CT  11/07/2017 Negative Negative  10/04/2017 Negative Negative    Assessment: [name] is here today to follow-up for PrEP.  He was seen last month for vaccines and was switched to Descovy  at that time. He has noticed no differences with Descovy and reports tolerating it well. No missed doses. He just got a shipment on Saturday 11/30 from the pharmacy.   He is very low risk for HIV acquisition. He is in a monogamous relationship with his fiance (who is a woman) that lives in [place]. He has not made plans for his next trip yet. No other partners.  Of note, he is concerned about an ongoing problem with recurrent headaches and back pain. He reports getting bitten by "tons" of mosquitos back about a year ago while he was in [place] visiting his fiance. Since then, he has noticed increased headaches and back pain.  He has been treating with NSAIDS PRN such as excedrin and ibuprofen, which help, but he has to take them multiple times per day.  He saw his PCP back in February for this and was given a trial of flexeril, imitrex, and a short course of prednisone.  He states the prednisone helped but the pain quickly came back after his course of treatment.  Flexeril makes him sleepy and he does not take it anymore.   He was referred to neurology and they ordered a MRI of his brain, which was normal. He also noticed swollen lymph nodes in his groin and armpit once he returned. No fevers, cough, chest pain, but does report some blurry vision and dizziness when his headaches are severe. He wanted me to run this past my physicians, which I can do.  I assume they will want him to either go back to his PCP for a re-eval or back to neurology. He mentioned a lumbar puncture to his neurologist and PCP back a few months back but nothing was done with it.   He also had pictures of a place on the top of his lip several months ago. It looks like a pimple. He states he squeezed it and it went away.  It did not look like herpes, but I told him to let me know if it comes back.  Will get a HIV antibody today and send in 3 more months of Descovy for him if negative.   Plan: - HIV antibody today - 3 more months  of Descovy if negative - Will discuss patient with MDs/NPs - F/u with me again 3/9 at 1045am for vaccines and PrEP appt  Geralda Baumgardner L. Mayrin Schmuck, PharmD, BCIDP, AAHIVP, CPP Infectious Diseases Clinical Pharmacist [facility] 02/04/2018, 11:17 AM

## 2018-02-05 ENCOUNTER — Telehealth: Payer: Self-pay | Admitting: Pharmacist

## 2018-02-05 DIAGNOSIS — Z7251 High risk heterosexual behavior: Secondary | ICD-10-CM

## 2018-02-05 LAB — HIV ANTIBODY (ROUTINE TESTING W REFLEX): HIV: NONREACTIVE

## 2018-02-05 MED ORDER — EMTRICITABINE-TENOFOVIR AF 200-25 MG PO TABS: 1.0000 | ORAL_TABLET | Freq: Every day | ORAL | 2 refills | Status: DC

## 2018-02-05 NOTE — Telephone Encounter (Signed)
Called patient to let him know that his HIV antibody was negative.  Will send in 3 more months of Descovy.  Discussed with providers here and they do not think patient's symptoms sounded like an ID issue. Patient verbalized understanding.

## 2018-02-28 MED FILL — DESCOVY 200-25 MG TABS: 200-25 | 30 days supply | Qty: 30 | Fill #0

## 2018-03-13 ENCOUNTER — Telehealth: Payer: Self-pay | Admitting: *Deleted

## 2018-03-13 NOTE — Telephone Encounter (Signed)
Pt Cd @ the front desk for p/u mr brain.

## 2018-03-26 MED FILL — DESCOVY 200-25 MG TABS: 200-25 | 30 days supply | Qty: 30 | Fill #1

## 2018-04-24 MED FILL — DESCOVY 200-25 MG TABS: 200-25 | 30 days supply | Qty: 30 | Fill #2

## 2018-05-13 ENCOUNTER — Ambulatory Visit: Payer: 59 | Admitting: Pharmacist

## 2018-05-14 ENCOUNTER — Ambulatory Visit (INDEPENDENT_AMBULATORY_CARE_PROVIDER_SITE_OTHER): Payer: 59 | Admitting: Pharmacist

## 2018-05-14 DIAGNOSIS — Z7251 High risk heterosexual behavior: Secondary | ICD-10-CM | POA: Diagnosis not present

## 2018-05-14 DIAGNOSIS — Z23 Encounter for immunization: Secondary | ICD-10-CM

## 2018-05-14 NOTE — Patient Instructions (Addendum)
Please pick up some vitamin b6 and take 50 mg PO daily.

## 2018-05-14 NOTE — Progress Notes (Signed)
Date:  05/14/2018   HPI: Frank Hancock is a 33 y.o. male who presents to the [facility] clinic for 3 month PrEP follow-up, 3rd HBV vaccination, and 3rd HPV vaccination. 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  Insured   [x]    Uninsured  []    Patient Active Problem List   Diagnosis Date Noted  . Annual physical exam 12/21/2015  . PCP NOTES >>>>>>>>>>>>>>>>>>>>> 10/01/2015  . Anxiety state 10/01/2015  . Dyspnea 06/24/2014  . Chest pain 06/24/2014  . Palpitations 06/24/2014    Patient's Medications  New Prescriptions   No medications on file  Previous Medications   ASPIRIN-ACETAMINOPHEN-CAFFEINE (EXCEDRIN PO)    Take 1 tablet by mouth as needed (Headaches).   EMTRICITABINE-TENOFOVIR AF (DESCOVY) 200-25 MG TABLET    Take 1 tablet by mouth daily.   ISONIAZID (NYDRAZID) 300 MG TABLET    Take 900 mg by mouth once a week.   PYRIDOXINE (VITAMIN B-6) 50 MG TABLET    Take 50 mg by mouth daily.   RIFAPENTINE 150 MG TABS    Take 900 mg by mouth once a week.  Modified Medications   No medications on file  Discontinued Medications   No medications on file    Allergies: No Known Allergies  Past Medical History: Past Medical History:  Diagnosis Date  . Chest pain   . Frequent headaches   . H/O syncope    pre- syncope, no LOC  . History of palpitations   . Hyperlipidemia   . Hypertension         Social History: Social History   Socioeconomic History  . Marital status: Single    Spouse name: Not on file  . Number of children: 0  . Years of education: Not on file  . Highest education level: Not on file  Occupational History  . 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 Financial resource strain: Not on file  . Food insecurity:    Worry: Not on file    Inability: Not on file  . Transportation needs:    Medical: Not on file   Non-medical: Not on file  Tobacco Use  . Smoking status: Never Smoker  . Smokeless tobacco: Never Used  Substance and Sexual Activity  . Alcohol use: Yes    Alcohol/week: 0.0 standard drinks    Comment: Occasional  . Drug use: No  . Sexual activity: Not on file  Lifestyle  . Physical activity:    Days per week: Not on file    Minutes per session: Not on file  . Stress: Not on file  Relationships  . Social connections:    Talks on phone: Not on file    Gets together: Not on file    Attends religious service: Not on file    Active member of club or organization: Not on file    Attends meetings of clubs or organizations: Not on file    Relationship status: Not on file  Other Topics Concern  . Not on file  Social History Narrative   Born in [place]   Moved from [place] 2016, lives w/ mother     [facility] HIV PREP FLOWSHEET RESULTS 02/04/2018 11/07/2017 10/04/2017  Insurance Status Insured Insured Insured  Gender at birth Male Male Male  Gender identity cis-Male cis-Male cis-Male  Risk for HIV - Condomless vaginal  or anal intercourse -  Sex Partners Women only Women only Women only  # sex partners past 3-6 mos (No Data) 1-3 1-3  Sex activity preferences Insertive Insertive -  Condom use - Yes Yes  % condom use - 90 90  Treated for STI? No - No  HIV symptoms? N/A N/A N/A    Labs:  SCr: Lab Results  Component Value Date   CREATININE 0.88 11/07/2017   CREATININE 0.81 10/04/2017   CREATININE 0.85 01/04/2017   CREATININE 0.93 09/30/2015   HIV Lab Results  Component Value Date   HIV NON-REACTIVE 02/04/2018   HIV NON-REACTIVE 11/07/2017   HIV NON-REACTIVE 10/04/2017   HIV NON-REACTIVE 01/31/2017   HIV NON-REACTIVE 01/04/2017   Hepatitis B Lab Results  Component Value Date   HEPBSAB NON-REACTIVE 10/04/2017   HEPBSAG NON-REACTIVE 10/04/2017   Hepatitis C Lab Results  Component Value Date   HEPCAB NON-REACTIVE 10/04/2017   Hepatitis A Lab Results  Component Value  Date   HAV REACTIVE (A) 10/04/2017   RPR and STI Lab Results  Component Value Date   LABRPR NON-REACTIVE 11/07/2017   LABRPR NON-REACTIVE 10/04/2017   LABRPR NON-REACTIVE 01/04/2017    STI Results GC CT  11/07/2017 Negative Negative  10/04/2017 Negative Negative    Assessment: Frank Hancock is a 33 y.o. male who presents to the [facility] pharmacy clinic for 3 month PrEP follow-up [Descovy 12/2017 - present, Truvada  10/2017 -12/2017]. He has consistently tested negative for HIV and STIs. He reports that he is in a monogamous relationship with his finance in [place], and will not be travelling there again until October 2020 [Insertive only, Women only, 1 partner, condomless sexual contact]. He does not use injection medications or illicit substances.  His risk of HIV infection is low based on his risk factors. Despite low risk, patient has expressed interest in PrEP. Patient endorsed that he has tolerated the Descovy well and has no missed doses.  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 Pharmacist check of drug interactions revealed that rifapentine lowers the concentration of TAF, which could impact efficacy.  Discussed options with patient, including switching back to Truvada, or holding his Descovy while on his TB treatment since he reported he has no other sexual partners, and will not see his current partner until October.  Patient agreed to stop taking Descovy today, and in 3 months at follow-up we will discuss restarting once his TB treatment is complete.  Patient received 3rd HBV vaccination, and 3rd HPV vaccination today. Discussed checking his HBV immunity at his visit in 3 months.  Plan: - Labs: HIV antibody, CMP today - Follow-up in , to discuss restarting PrEP once TB treatment is complete - Confirm HBV immunity (HBs antibody) at follow-up.  Lenora Boys, PharmD Candidate,  [employer] [facility] 05/14/2018, 11:57 AM

## 2018-05-15 ENCOUNTER — Telehealth: Payer: Self-pay | Admitting: Pharmacist

## 2018-05-15 LAB — COMPREHENSIVE METABOLIC PANEL
AG Ratio: 1.7 (calc) (ref 1.0–2.5)
ALKALINE PHOSPHATASE (APISO): 71 U/L (ref 36–130)
ALT: 44 U/L (ref 9–46)
AST: 29 U/L (ref 10–40)
Albumin: 4.8 g/dL (ref 3.6–5.1)
BUN: 9 mg/dL (ref 7–25)
CHLORIDE: 102 mmol/L (ref 98–110)
CO2: 25 mmol/L (ref 20–32)
Calcium: 10.2 mg/dL (ref 8.6–10.3)
Creat: 0.81 mg/dL (ref 0.60–1.35)
GLOBULIN: 2.9 g/dL (ref 1.9–3.7)
Glucose, Bld: 87 mg/dL (ref 65–99)
Potassium: 4.1 mmol/L (ref 3.5–5.3)
Sodium: 139 mmol/L (ref 135–146)
Total Bilirubin: 1.1 mg/dL (ref 0.2–1.2)
Total Protein: 7.7 g/dL (ref 6.1–8.1)

## 2018-05-15 LAB — HIV ANTIBODY (ROUTINE TESTING W REFLEX): HIV: NONREACTIVE

## 2018-05-15 NOTE — Telephone Encounter (Signed)
Called patient to let him know that his HIV antibody was negative.  He is going to stop taking Descovy while taking the INH and rifapentine. He will resume in June once he has completed treatment for latent TB.

## 2018-08-01 ENCOUNTER — Telehealth: Payer: Self-pay | Admitting: Pharmacist

## 2018-08-01 NOTE — Telephone Encounter (Signed)
COVID-19 Pre-Screening Questions: ° °Do you currently have a fever (>100 °F), chills or unexplained body aches? No  ° °Are you currently experiencing new cough, shortness of breath, sore throat, runny nose? No  °•  °Have you recently travelled outside the state of [place] in the last 14 days? No  °•  °Have you been in contact with someone that is currently pending confirmation of Covid19 testing or has been confirmed to have the Covid19 virus?  No  °

## 2018-08-05 ENCOUNTER — Ambulatory Visit (INDEPENDENT_AMBULATORY_CARE_PROVIDER_SITE_OTHER): Payer: 59 | Admitting: Pharmacist

## 2018-08-05 ENCOUNTER — Other Ambulatory Visit: Payer: Self-pay

## 2018-08-05 DIAGNOSIS — Z7251 High risk heterosexual behavior: Secondary | ICD-10-CM | POA: Diagnosis not present

## 2018-08-05 NOTE — Progress Notes (Signed)
Date:  08/05/2018   HPI: Frank Hancock is a 33 y.o. male who presents to the [facility] pharmacy clinic for 3 month PrEP follow-up.  Insured   [x]    Uninsured  []    Patient Active Problem List   Diagnosis Date Noted  . Annual physical exam 12/21/2015  . PCP NOTES >>>>>>>>>>>>>>>>>>>>> 10/01/2015  . Anxiety state 10/01/2015  . Dyspnea 06/24/2014  . Chest pain 06/24/2014  . Palpitations 06/24/2014    Patient's Medications  New Prescriptions   No medications on file  Previous Medications   ASPIRIN-ACETAMINOPHEN-CAFFEINE (EXCEDRIN PO)    Take 1 tablet by mouth as needed (Headaches).   ISONIAZID (NYDRAZID) 300 MG TABLET    Take 900 mg by mouth once a week.   PYRIDOXINE (VITAMIN B-6) 50 MG TABLET    Take 50 mg by mouth daily.   RIFAPENTINE 150 MG TABS    Take 900 mg by mouth once a week.  Modified Medications   No medications on file  Discontinued Medications   No medications on file    Allergies: No Known Allergies  Past Medical History: Past Medical History:  Diagnosis Date  . Chest pain   . Frequent headaches   . H/O syncope    pre- syncope, no LOC  . History of palpitations   . Hyperlipidemia   . Hypertension         Social History: Social History   Socioeconomic History  . Marital status: Single    Spouse name: Not on file  . Number of children: 0  . Years of education: Not on file  . Highest education level: Not on file  Occupational History  . Occupation: Insurance underwritercomputer tech - [employer]    Comment: Computer  Social Needs  . Financial resource strain: Not on file  . Food insecurity:    Worry: Not on file    Inability: Not on file  . Transportation needs:    Medical: Not on file    Non-medical: Not on file  Tobacco Use  . Smoking status: Never Smoker  . Smokeless tobacco: Never Used  Substance and Sexual Activity  . Alcohol use: Yes    Alcohol/week: 0.0 standard drinks    Comment: Occasional  . Drug use: No  . Sexual activity: Not on file  Lifestyle  .  Physical activity:    Days per week: Not on file    Minutes per session: Not on file  . Stress: Not on file  Relationships  . Social connections:    Talks on phone: Not on file    Gets together: Not on file    Attends religious service: Not on file    Active member of club or organization: Not on file    Attends meetings of clubs or organizations: Not on file    Relationship status: Not on file  Other Topics Concern  . Not on file  Social History Narrative   Born in [place]   Moved from [place] 2016, lives w/ mother     [facility] 08/05/2018 02/04/2018 11/07/2017 10/04/2017  Insurance Status Insured Insured Insured Insured  Gender at birth Male Male Male Male  Gender identity cis-Male cis-Male cis-Male cis-Male  Risk for HIV Hx of STI - Condomless vaginal or anal intercourse -  Sex Partners Women only Women only Women only Women only  # sex partners past 3-6 mos - (No Data) 1-3 1-3  Sex activity preferences - Insertive Insertive -  Condom use - -  Yes Yes  % condom use - - 90 90  Treated for STI? No No - No  HIV symptoms? N/A N/A N/A N/A  PrEP Eligibility Substantial risk for HIV - - -    Labs:  SCr: Lab Results  Component Value Date   CREATININE 0.81 05/14/2018   CREATININE 0.88 11/07/2017   CREATININE 0.81 10/04/2017   CREATININE 0.85 01/04/2017   CREATININE 0.93 09/30/2015   HIV Lab Results  Component Value Date   HIV NON-REACTIVE 05/14/2018   HIV NON-REACTIVE 02/04/2018   HIV NON-REACTIVE 11/07/2017   HIV NON-REACTIVE 10/04/2017   HIV NON-REACTIVE 01/31/2017   Hepatitis B Lab Results  Component Value Date   HEPBSAB NON-REACTIVE 10/04/2017   HEPBSAG NON-REACTIVE 10/04/2017   Hepatitis C Lab Results  Component Value Date   HEPCAB NON-REACTIVE 10/04/2017   Hepatitis A Lab Results  Component Value Date   HAV REACTIVE (A) 10/04/2017   RPR and STI Lab Results  Component Value Date   LABRPR NON-REACTIVE 11/07/2017   LABRPR  NON-REACTIVE 10/04/2017   LABRPR NON-REACTIVE 01/04/2017    STI Results GC CT  11/07/2017 Negative Negative  10/04/2017 Negative Negative    Assessment: [place] H[place] is here today to follow-up for PrEP.  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  He has since finished the course and is ready to begin PrEP again.  He remains monogamous with his fiance in [place].  He has not seen her in quite some time due to the COVID 19 pandemic.  She was supposed to come and visit him last month but the borders are closed. He doesn't think he'll be able to see her until January of 2021. He is extremely low risk as he states that the doesn't have any other partners besides her, but wishes to take PrEP anyway.  He has seen [facility] for several other issues and is asking me to draw a trypanosoma cruz antibody that they requested.  He did not want to get labs twice (he just saw them on 5/27). From their note, it didn't sound like they thought it would be very beneficial but will order today.  Will also check to see if he developed immunity to Hepatitis B and check for HIV. Will see him back in 3 months.  Plan: - HIV antibody and Hepatitis B surf antibody today - Descovy x 3 months if HIV negative - F/u with me again 8/25 at 1130am  Cassie L. Kuppelweiser, PharmD, BCIDP, AAHIVP, CPP Infectious Diseases Clinical Pharmacist [facility] 08/05/2018, 3:18 PM

## 2018-08-06 ENCOUNTER — Telehealth: Payer: Self-pay | Admitting: Pharmacist

## 2018-08-06 DIAGNOSIS — Z7251 High risk heterosexual behavior: Secondary | ICD-10-CM

## 2018-08-06 MED ORDER — EMTRICITABINE-TENOFOVIR AF 200-25 MG PO TABS: 1.0000 | ORAL_TABLET | Freq: Every day | ORAL | 2 refills | Status: DC

## 2018-08-06 MED FILL — DESCOVY 200-25 MG TABS: 200-25 | 30 days supply | Qty: 30 | Fill #0

## 2018-08-06 NOTE — Telephone Encounter (Signed)
Called patient to let him know that his HIV antibody was negative.  Will send in 3 months of Descovy.  

## 2018-08-09 LAB — HEPATITIS B SURFACE ANTIBODY,QUALITATIVE: Hep B S Ab: REACTIVE — AB

## 2018-08-09 LAB — HIV ANTIBODY (ROUTINE TESTING W REFLEX): HIV 1&2 Ab, 4th Generation: NONREACTIVE

## 2018-08-09 LAB — TRYPANOSOMA CRUZI ANTIBODY, TOTAL: T. cruzi antibody, total: NONREACTIVE

## 2018-09-09 MED FILL — DESCOVY 200-25 MG TABS: 200-25 | 30 days supply | Qty: 30 | Fill #1

## 2018-10-08 MED FILL — DESCOVY 200-25 MG TABS: 200-25 | 30 days supply | Qty: 30 | Fill #2

## 2018-10-29 ENCOUNTER — Ambulatory Visit: Payer: 59 | Admitting: Pharmacist

## 2018-10-31 ENCOUNTER — Ambulatory Visit (INDEPENDENT_AMBULATORY_CARE_PROVIDER_SITE_OTHER): Payer: 59 | Admitting: Pharmacist

## 2018-10-31 ENCOUNTER — Other Ambulatory Visit: Payer: Self-pay

## 2018-10-31 DIAGNOSIS — Z7251 High risk heterosexual behavior: Secondary | ICD-10-CM

## 2018-10-31 NOTE — Progress Notes (Signed)
Date:  10/31/2018   HPI: Olen CordialLuis A Klinck is a 33 y.o. male who presents to the [facility] pharmacy clinic for 3 month PrEP follow-up.  Insured   [x]    Uninsured  []    Patient Active Problem List   Diagnosis Date Noted  . Annual physical exam 12/21/2015  . PCP NOTES >>>>>>>>>>>>>>>>>>>>> 10/01/2015  . Anxiety state 10/01/2015  . Dyspnea 06/24/2014  . Chest pain 06/24/2014  . Palpitations 06/24/2014    Patient's Medications  New Prescriptions   No medications on file  Previous Medications   ASPIRIN-ACETAMINOPHEN-CAFFEINE (EXCEDRIN PO)    Take 1 tablet by mouth as needed (Headaches).   EMTRICITABINE-TENOFOVIR AF (DESCOVY) 200-25 MG TABLET    Take 1 tablet by mouth daily.  Modified Medications   No medications on file  Discontinued Medications   No medications on file    Allergies: No Known Allergies  Past Medical History: Past Medical History:  Diagnosis Date  . Chest pain   . Frequent headaches   . H/O syncope    pre- syncope, no LOC  . History of palpitations   . Hyperlipidemia   . Hypertension         Social History: Social History   Socioeconomic History  . Marital status: Single    Spouse name: Not on file  . Number of children: 0  . Years of education: Not on file  . Highest education level: Not on file  Occupational History  . Occupation: Insurance underwritercomputer tech - [employer]    Comment: Computer  Social Needs  . Financial resource strain: Not on file  . Food insecurity    Worry: Not on file    Inability: Not on file  . Transportation needs    Medical: Not on file    Non-medical: Not on file  Tobacco Use  . Smoking status: Never Smoker  . Smokeless tobacco: Never Used  Substance and Sexual Activity  . Alcohol use: Yes    Alcohol/week: 0.0 standard drinks    Comment: Occasional  . Drug use: No  . Sexual activity: Not on file  Lifestyle  . Physical activity    Days per week: Not on file    Minutes per session: Not on file  . Stress: Not on file   Relationships  . Social Musicianconnections    Talks on phone: Not on file    Gets together: Not on file    Attends religious service: Not on file    Active member of club or organization: Not on file    Attends meetings of clubs or organizations: Not on file    Relationship status: Not on file  Other Topics Concern  . Not on file  Social History Narrative   Born in [place]   Moved from [place] 2016, lives w/ mother     [facility] 08/05/2018 02/04/2018 11/07/2017 10/04/2017  Insurance Status Insured Insured Insured Insured  Gender at birth Male Male Male Male  Gender identity cis-Male cis-Male cis-Male cis-Male  Risk for HIV Hx of STI - Condomless vaginal or anal intercourse -  Sex Partners Women only Women only Women only Women only  # sex partners past 3-6 mos - (No Data) 1-3 1-3  Sex activity preferences - Insertive Insertive -  Condom use - - Yes Yes  % condom use - - 90 90  Treated for STI? No No - No  HIV symptoms? N/A N/A N/A N/A  PrEP Eligibility Substantial risk for HIV - - -  Labs:  SCr: Lab Results  Component Value Date   CREATININE 0.81 05/14/2018   CREATININE 0.88 11/07/2017   CREATININE 0.81 10/04/2017   CREATININE 0.85 01/04/2017   CREATININE 0.93 09/30/2015   HIV Lab Results  Component Value Date   HIV NON-REACTIVE 08/05/2018   HIV NON-REACTIVE 05/14/2018   HIV NON-REACTIVE 02/04/2018   HIV NON-REACTIVE 11/07/2017   HIV NON-REACTIVE 10/04/2017   Hepatitis B Lab Results  Component Value Date   HEPBSAB REACTIVE (A) 08/05/2018   HEPBSAG NON-REACTIVE 10/04/2017   Hepatitis C Lab Results  Component Value Date   HEPCAB NON-REACTIVE 10/04/2017   Hepatitis A Lab Results  Component Value Date   HAV REACTIVE (A) 10/04/2017   RPR and STI Lab Results  Component Value Date   LABRPR NON-REACTIVE 11/07/2017   LABRPR NON-REACTIVE 10/04/2017   LABRPR NON-REACTIVE 01/04/2017    STI Results GC CT  11/07/2017 Negative Negative   10/04/2017 Negative Negative    Assessment: Dhanvin is here today to follow up for PrEP.  He restarted Descovy at his last visit with me after he completed treatment for latent TB. He is very low risk of HIV acquisition as he only has one partner who is a male and lives in [place] (actually his fiance).  He states that he has no other partners. He got a letter from his [employer] that is requiring him to switch to Truvada.  He is wondering if he can just stop PrEP and restart when he sees his fiance again. He has not seen her since January and will not be able to go see her again until February or March of next year as the [place] border is closed due to the Pray 19 pandemic.  I advised him that I thought it was perfectly ok for him to stop PrEP as he is low risk and as long as he is being truthful about his partners. He states that he is, so he will stop for now.  He will call me before he goes to see his fiance again and he will restart at that time. He states that she does not have any other partners either, so I am unsure why he is so insistent about restarting before seeing her.  Nonetheless, I will restart PrEP before he goes to see her.  He will call me once he schedules that trip next year.  Plan: - Stop PrEP - Call when you wish to restart  Naquisha Whitehair L. Kmari Halter, PharmD, BCIDP, AAHIVP, [facility] 10/31/2018, 4:17 PM

## 2020-04-16 NOTE — Progress Notes (Deleted)
    Referring-self-referral Reason for referral-chest pain and palpitations.  HPI: 35 year old male for evaluation of chest pain and palpitations, self-referral.  Seen previously but not since 2016.  Echocardiogram April 2016 showed normal LV function and grade 1 diastolic dysfunction.  Exercise treadmill [facility] 2017 with no evidence of ischemia.  Current Outpatient Medications  Medication Sig Dispense Refill  . Aspirin-Acetaminophen-Caffeine (EXCEDRIN PO) Take 1 tablet by mouth as needed (Headaches).    Marland Kitchen emtricitabine-tenofovir AF (DESCOVY) 200-25 MG tablet Take 1 tablet by mouth daily. 30 tablet 2   No current facility-administered medications for this visit.    No Known Allergies  Past Medical History:  Diagnosis Date  . Chest pain   . Frequent headaches   . H/O syncope    pre- syncope, no LOC  . History of palpitations   . Hyperlipidemia   . Hypertension         Past Surgical History:  Procedure Laterality Date  . CHEST SURGERY  2001   cosmetic, breast reduction    Social History   Socioeconomic History  . Marital status: Single    Spouse name: Not on file  . Number of children: 0  . Years of education: Not on file  . Highest education level: Not on file  Occupational History  . 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 Smoking status: Never Smoker  . Smokeless tobacco: Never Used  Substance and Sexual Activity  . Alcohol use: Yes    Alcohol/week: 0.0 standard drinks    Comment: Occasional  . Drug use: No  . Sexual activity: Not on file  Other Topics Concern  . Not on file  Social History Narrative   Born in [place]   Moved from [place] 2016, lives w/ mother    Social Determinants of Health   Financial Resource Strain: Not on [employer] Insecurity: Not on file  Transportation Needs: Not on file  Physical Activity: Not on file  Stress: Not on file  Social Connections: Not on file  Intimate Partner  Violence: Not on file    Family History  Problem Relation Age of Onset  . Hypertension Mother   . Hypertension Sister   . Diabetes Brother   . Hypertension Brother   . Heart defect Maternal Grandmother        valve repair  . Colon cancer Neg Hx   . Prostate cancer Neg Hx   . CAD Neg Hx     ROS: no fevers or chills, productive cough, hemoptysis, dysphasia, odynophagia, melena, hematochezia, dysuria, hematuria, rash, seizure activity, orthopnea, PND, pedal edema, claudication. Remaining systems are negative.  Physical Exam:   There were no vitals taken for this visit.  General:  Well developed/well nourished in NAD Skin warm/dry Patient not depressed No peripheral clubbing Back-normal HEENT-normal/normal eyelids Neck supple/normal carotid upstroke bilaterally; no bruits; no JVD; no thyromegaly chest - CTA/ normal expansion CV - RRR/normal S1 and S2; no murmurs, rubs or gallops;  PMI nondisplaced Abdomen -NT/ND, no HSM, no mass, + bowel sounds, no bruit 2+ femoral pulses, no bruits Ext-no edema, chords, 2+ DP Neuro-grossly nonfocal  ECG - personally reviewed  A/P  1 chest pain-  2 palpitations-  3 hypertension-blood pressure controlled.  Continue present medications.  4 hyperlipidemia-  Olga Millers, MD

## 2020-04-28 ENCOUNTER — Ambulatory Visit: Payer: 59 | Admitting: Cardiology

## 2021-03-15 ENCOUNTER — Other Ambulatory Visit (HOSPITAL_COMMUNITY): Payer: Self-pay

## 2021-03-15 ENCOUNTER — Ambulatory Visit (INDEPENDENT_AMBULATORY_CARE_PROVIDER_SITE_OTHER): Payer: 59 | Admitting: Pharmacist

## 2021-03-15 ENCOUNTER — Other Ambulatory Visit: Payer: Self-pay

## 2021-03-15 ENCOUNTER — Other Ambulatory Visit (HOSPITAL_COMMUNITY)
Admission: RE | Admit: 2021-03-15 | Discharge: 2021-03-15 | Disposition: A | Discharge status: Home / Self Care | Payer: 59 | Source: Ambulatory Visit | Attending: Infectious Disease | Admitting: Infectious Disease

## 2021-03-15 DIAGNOSIS — Z79899 Other long term (current) drug therapy: Secondary | ICD-10-CM | POA: Insufficient documentation

## 2021-03-15 NOTE — Progress Notes (Signed)
Date:  03/15/2021   HPI: Frank Hancock is a 36 y.o. male who [facility] to [facility] for HIV PrEP follow-up.  Insured   [x]    Uninsured  []    Patient Active Problem List   Diagnosis Date Noted   Annual physical exam 12/21/2015   PCP NOTES >>>>>>>>>>>>>>>>>>>>> 10/01/2015   Anxiety state 10/01/2015   Dyspnea 06/24/2014   Chest pain 06/24/2014   Palpitations 06/24/2014    Patient's Medications  New Prescriptions   No medications on file  Previous Medications   ASPIRIN-ACETAMINOPHEN-CAFFEINE (EXCEDRIN PO)    Take 1 tablet by mouth as needed (Headaches).   EMTRICITABINE-TENOFOVIR AF (DESCOVY) 200-25 MG TABLET    Take 1 tablet by mouth daily.  Modified Medications   No medications on file  Discontinued Medications   No medications on file    Allergies: No Known Allergies  Past Medical History: Past Medical History:  Diagnosis Date   Chest pain    Frequent headaches    H/O syncope    pre- syncope, no LOC   History of palpitations    Hyperlipidemia    Hypertension         Social History: Social History   Socioeconomic History   Marital status: Single    Spouse name: Not on file   Number of children: 0   Years of education: Not on file   Highest education level: Not on file  Occupational History   Occupation: [facility]    Comment: Computer  Tobacco Use   Smoking status: Never   Smokeless tobacco: Never  Substance and Sexual Activity   Alcohol use: Yes    Alcohol/week: 0.0 standard drinks    Comment: Occasional   Drug use: No   Sexual activity: Not on file  Other Topics Concern   Not on file  Social History Narrative   Born in [place]   Moved from [place] 2016, lives w/ mother    Social Determinants of Health   Financial Resource Strain: Not on file  Food Insecurity: Not on file  Transportation Needs: Not on file  Physical Activity: Not on file  Stress: Not on file  Social Connections: Not on file    CHL HIV  PREP FLOWSHEET RESULTS 08/05/2018 02/04/2018 11/07/2017 10/04/2017  Insurance Status Insured Insured Insured Insured  Gender at birth Male Male Male Male  Gender identity cis-Male cis-Male cis-Male cis-Male  Risk for HIV Hx of STI - Condomless vaginal or anal intercourse -  Sex Partners Women only Women only Women only Women only  # sex partners past 3-6 mos - (No Data) 1-3 1-3  Sex activity preferences - Insertive Insertive -  Condom use - - Yes Yes  % condom use - - 90 90  Treated for STI? No No - No  HIV symptoms? N/A N/A N/A N/A  PrEP Eligibility Substantial risk for HIV - - -    Labs:  SCr: Lab Results  Component Value Date   CREATININE 0.81 05/14/2018   CREATININE 0.88 11/07/2017   CREATININE 0.81 10/04/2017   CREATININE 0.85 01/04/2017   CREATININE 0.93 09/30/2015   HIV Lab Results  Component Value Date   HIV NON-REACTIVE 08/05/2018   HIV NON-REACTIVE 05/14/2018   HIV NON-REACTIVE 02/04/2018   HIV NON-REACTIVE 11/07/2017   HIV NON-REACTIVE 10/04/2017   Hepatitis B Lab Results  Component Value Date   HEPBSAB REACTIVE (A) 08/05/2018   HEPBSAG NON-REACTIVE 10/04/2017   Hepatitis C Lab Results  Component Value Date   HEPCAB NON-REACTIVE 10/04/2017   Hepatitis A Lab Results  Component Value Date   HAV REACTIVE (A) 10/04/2017   RPR and STI Lab Results  Component Value Date   LABRPR NON-REACTIVE 11/07/2017   LABRPR NON-REACTIVE 10/04/2017   LABRPR NON-REACTIVE 01/04/2017    STI Results GC CT  11/07/2017 Negative Negative  10/04/2017 Negative Negative    Assessment: [facility] H[facility] presents to clinic today to re-initiate HIV PrEP. He was previously on Descovy in 2020 when he was dating a male that lived in [place]. He stopped PrEP due to not seeing her for a while and ultimately is not in a relationship with her anymore due to difficulties with the pandemic. He is now interested in restarting PrEP because he is dating again. He is only with females and unsure of what  high-risk behaviors that partners are participating in. He denies any acute HIV symptoms today. We discussed PrEP options of Truvada, Descovy, and Apretude. Truvada is preferred by his insurance. He would like some time to think on Apretude. We will send in Truvada x3 months to [facility] for mailing if HIV is negative.    Plan: Check HIV antibody, RPR, Urine Cytology, Lipid Panel, BMET Follow-up on 06/13/21 @ 1:45 with [facility] [place] Truvada x3 months if HIV negative  Lestine Box, PharmD PGY2 Infectious Diseases Pharmacy Resident

## 2021-03-16 ENCOUNTER — Other Ambulatory Visit (HOSPITAL_COMMUNITY): Payer: Self-pay

## 2021-03-16 ENCOUNTER — Other Ambulatory Visit: Payer: Self-pay

## 2021-03-16 DIAGNOSIS — Z79899 Other long term (current) drug therapy: Secondary | ICD-10-CM

## 2021-03-16 LAB — HIV ANTIBODY (ROUTINE TESTING W REFLEX): HIV 1&2 Ab, 4th Generation: NONREACTIVE

## 2021-03-16 LAB — BASIC METABOLIC PANEL
BUN: 13 mg/dL (ref 7–25)
CO2: 25 mmol/L (ref 20–32)
Calcium: 9.8 mg/dL (ref 8.6–10.3)
Chloride: 104 mmol/L (ref 98–110)
Creat: 0.93 mg/dL (ref 0.60–1.26)
Glucose, Bld: 89 mg/dL (ref 65–99)
Potassium: 4 mmol/L (ref 3.5–5.3)
Sodium: 140 mmol/L (ref 135–146)

## 2021-03-16 LAB — LIPID PANEL
Cholesterol: 202 mg/dL — ABNORMAL HIGH (ref <200)
HDL: 42 mg/dL (ref >=40)
LDL Cholesterol (Calc): 128 mg/dL (calc) — ABNORMAL HIGH
Non-HDL Cholesterol (Calc): 160 mg/dL (calc) — ABNORMAL HIGH (ref <130)
Total CHOL/HDL Ratio: 4.8 (calc) (ref <5.0)
Triglycerides: 185 mg/dL — ABNORMAL HIGH (ref <150)

## 2021-03-16 LAB — URINE CYTOLOGY ANCILLARY ONLY
Chlamydia: NEGATIVE
Comment: NEGATIVE
Comment: NORMAL
Neisseria Gonorrhea: NEGATIVE

## 2021-03-16 LAB — RPR: RPR Ser Ql: NONREACTIVE

## 2021-03-16 MED ORDER — EMTRICITABINE-TENOFOVIR DF 200-300 MG PO TABS
1.0000 | ORAL_TABLET | Freq: Every day | ORAL | 2 refills | Status: DC
  Filled 2021-03-16 – 2021-03-17 (×2): qty 30, 30d supply, fill #0
  Filled 2021-04-06: qty 30, 30d supply, fill #1
  Filled 2021-05-02: qty 30, 30d supply, fill #2

## 2021-03-16 NOTE — Progress Notes (Signed)
HIV antibody returned negative. Will send in Truvada x 3 months to [facility] for mailing.   Shirlee More, PharmD PGY2 Infectious Diseases Pharmacy Resident   Please check [email or web address] for unit-specific pharmacy phone numbers

## 2021-03-17 ENCOUNTER — Other Ambulatory Visit (HOSPITAL_COMMUNITY): Payer: Self-pay

## 2021-03-21 ENCOUNTER — Ambulatory Visit: Payer: 59 | Admitting: Pharmacist

## 2021-04-06 ENCOUNTER — Other Ambulatory Visit (HOSPITAL_COMMUNITY): Payer: Self-pay

## 2021-04-07 ENCOUNTER — Other Ambulatory Visit (HOSPITAL_COMMUNITY): Payer: Self-pay

## 2021-04-11 ENCOUNTER — Other Ambulatory Visit (HOSPITAL_COMMUNITY): Payer: Self-pay

## 2021-05-02 ENCOUNTER — Other Ambulatory Visit (HOSPITAL_COMMUNITY): Payer: Self-pay

## 2021-05-05 ENCOUNTER — Other Ambulatory Visit (HOSPITAL_COMMUNITY): Payer: Self-pay

## 2021-05-27 ENCOUNTER — Other Ambulatory Visit (HOSPITAL_COMMUNITY): Payer: Self-pay

## 2021-05-27 ENCOUNTER — Other Ambulatory Visit: Payer: Self-pay | Admitting: Pharmacist

## 2021-05-27 DIAGNOSIS — Z79899 Other long term (current) drug therapy: Secondary | ICD-10-CM

## 2021-05-31 ENCOUNTER — Other Ambulatory Visit (HOSPITAL_COMMUNITY): Payer: Self-pay

## 2021-06-06 ENCOUNTER — Ambulatory Visit: Payer: 59 | Admitting: Pharmacist

## 2021-06-08 ENCOUNTER — Other Ambulatory Visit (HOSPITAL_COMMUNITY): Payer: Self-pay

## 2021-06-09 ENCOUNTER — Other Ambulatory Visit (HOSPITAL_COMMUNITY): Payer: Self-pay

## 2021-06-10 ENCOUNTER — Other Ambulatory Visit (HOSPITAL_COMMUNITY): Payer: Self-pay

## 2021-06-13 ENCOUNTER — Ambulatory Visit (INDEPENDENT_AMBULATORY_CARE_PROVIDER_SITE_OTHER): Payer: 59 | Admitting: Pharmacist

## 2021-06-13 ENCOUNTER — Other Ambulatory Visit (HOSPITAL_COMMUNITY)
Admission: RE | Admit: 2021-06-13 | Discharge: 2021-06-13 | Disposition: A | Discharge status: Home / Self Care | Payer: 59 | Source: Ambulatory Visit | Attending: Infectious Disease | Admitting: Infectious Disease

## 2021-06-13 ENCOUNTER — Ambulatory Visit: Payer: 59 | Admitting: Pharmacist

## 2021-06-13 ENCOUNTER — Other Ambulatory Visit: Payer: Self-pay

## 2021-06-13 ENCOUNTER — Other Ambulatory Visit (HOSPITAL_COMMUNITY): Payer: Self-pay

## 2021-06-13 DIAGNOSIS — Z79899 Other long term (current) drug therapy: Secondary | ICD-10-CM

## 2021-06-13 DIAGNOSIS — Z113 Encounter for screening for infections with a predominantly sexual mode of transmission: Secondary | ICD-10-CM | POA: Insufficient documentation

## 2021-06-13 MED ORDER — EMTRICITABINE-TENOFOVIR DF 200-300 MG PO TABS
1.0000 | ORAL_TABLET | Freq: Every day | ORAL | 2 refills | Status: DC
  Filled 2021-06-13: qty 30, 30d supply, fill #0
  Filled 2021-07-04: qty 30, 30d supply, fill #1
  Filled 2021-08-03: qty 30, 30d supply, fill #2

## 2021-06-13 NOTE — Progress Notes (Signed)
? ?Date:  06/13/2021  ? ?HPI: Frank Hancock is a 36 y.o. male who presents to the [facility] clinic for HIV PrEP follow-up. ? ?Insured   [x]    Uninsured  []   ? ?Patient Active Problem List  ? Diagnosis Date Noted  ? Annual physical exam 12/21/2015  ? PCP NOTES >>>>>>>>>>>>>>>>>>>>> 10/01/2015  ? Anxiety state 10/01/2015  ? Dyspnea 06/24/2014  ? Chest pain 06/24/2014  ? Palpitations 06/24/2014  ? ? ?Patient's Medications  ?New Prescriptions  ? No medications on file  ?Previous Medications  ? ASPIRIN-ACETAMINOPHEN-CAFFEINE (EXCEDRIN PO)    Take 1 tablet by mouth as needed (Headaches).  ? EMTRICITABINE-TENOFOVIR (TRUVADA) 200-300 MG TABLET    Take 1 tablet by mouth daily.  ?Modified Medications  ? No medications on file  ?Discontinued Medications  ? EMTRICITABINE-TENOFOVIR AF (DESCOVY) 200-25 MG TABLET    Take 1 tablet by mouth daily.  ? ? ?Allergies: ?No Known Allergies ? ?Past Medical History: ?Past Medical History:  ?Diagnosis Date  ? Chest pain   ? Frequent headaches   ? H/O syncope   ? pre- syncope, no LOC  ? History of palpitations   ? Hyperlipidemia   ? Hypertension   ?    ? ? ?Social History: ?Social History  ? ?Socioeconomic History  ? Marital status: Single  ?  Spouse name: Not on file  ? Number of children: 0  ? Years of education: Not on file  ? Highest education level: Not on file  ?Occupational History  ? 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  Comment: Computer  ?Tobacco Use  ? Smoking status: Never  ? Smokeless tobacco: Never  ?Substance and Sexual Activity  ? Alcohol use: Yes  ?  Alcohol/week: 0.0 standard drinks  ?  Comment: Occasional  ? Drug use: No  ? Sexual activity: Not on file  ?Other Topics Concern  ? Not on file  ?Social History Narrative  ? Born [place] [birth year][place][birth year][place]2015  ? Moved from [place], lives w/ mother   ? ?Social Determinants of Health  ? ?Financial Resource Strain: Not on file  ?Food Insecurity: Not on file  ?Transportation Needs: Not on file  ?Physical Activity: Not on file  ?Stress:  Not on file  ?Social Connections: Not on file  ? ? ? ?  08/05/2018  ?  3:18 PM 02/04/2018  ? 10:55 AM 11/07/2017  ? 11:39 AM 10/04/2017  ? 11:41 AM  ?CHL HIV PREP FLOWSHEET RESULTS  ?Insurance Status Insured Insured Insured Insured  ?Gender at birth Male Male Male Male  ?Gender identity cis-Male cis-Male cis-Male cis-Male  ?Risk for HIV Hx of STI  Condomless vaginal or anal intercourse   ?Sex Partners Women only Women only Women only Women only  ?# sex partners past 3-6 mos   1-3 1-3  ?Sex activity preferences  Insertive Insertive   ?Condom use   Yes Yes  ?% condom use   90 90  ?Treated for STI? No No  No  ?HIV symptoms? N/A N/A N/A N/A  ?PrEP Eligibility Substantial risk for HIV     ? ? ?Labs: ? ?SCr: ?Lab Results  ?Component Value Date  ? CREATININE 0.93 03/15/2021  ? CREATININE 0.81 05/14/2018  ? CREATININE 0.88 11/07/2017  ? CREATININE 0.81 10/04/2017  ? CREATININE 0.85 01/04/2017  ? ?HIV ?Lab Results  ?Component Value Date  ? HIV NON-REACTIVE 03/15/2021  ? HIV NON-REACTIVE 08/05/2018  ? HIV NON-REACTIVE 05/14/2018  ? HIV NON-REACTIVE 02/04/2018  ? HIV  NON-REACTIVE 11/07/2017  ? ?Hepatitis B ?Lab Results  ?Component Value Date  ? HEPBSAB REACTIVE (A) 08/05/2018  ? HEPBSAG NON-REACTIVE 10/04/2017  ? ?Hepatitis C ?Lab Results  ?Component Value Date  ? HEPCAB NON-REACTIVE 10/04/2017  ? ?Hepatitis A ?Lab Results  ?Component Value Date  ? HAV REACTIVE (A) 10/04/2017  ? ?RPR and STI ?Lab Results  ?Component Value Date  ? LABRPR NON-REACTIVE 03/15/2021  ? LABRPR NON-REACTIVE 11/07/2017  ? LABRPR NON-REACTIVE 10/04/2017  ? LABRPR NON-REACTIVE 01/04/2017  ? ? ?STI Results GC CT  ?03/15/2021 ? 2:14 PM Negative   Negative    ?11/07/2017 ?12:00 AM Negative   Negative    ?10/04/2017 ?12:00 AM Negative   Negative    ? ? ?Assessment: ?[place] presents to clinic today for HIV PrEP follow-up. He was recently started on Truvada at his last visit on 03/15/2021. He reports experiencing nausea during first few days of starting medication but has  since been resolved. He reports no other issues with Truvada, no issues with obtaining medication, and having no missed doses. He reports no new sexual partners since last visit, but agreeable to STI testing today. He denies any acute HIV symptoms today. We discussed Apretude today and Frank Hancock would like more time to think about Apretude. Will send in Truvada x3 months to [facility] for mailing if HIV is negative.  ? ?[place] inquires about a skin lesion on his chest and was concerned it was herpes-related. Per Cassie and my assessment, it does not look like herpes or STI-related. Appears to be like an ingrown hair scabbed over. Patient reports bleeding when he scratched it and itchy when it appeared but not currently itchy. Denies pain and pus from wound. Recommended patient to monitor and to consult PCP/dermatologist if it gets worse.  ? ?Plan: ?-Check HIV antibody, RPR, Urine Cytology ?-Follow Up on lab results ?-Follow-Up on 09/12/2021 at 2:30pm with Cassie ?-Truvada x3 months if HIV negative.  ? ?Gaye Alken, PharmD Candidate ?06/13/2021 2:59 PM ? ? ?

## 2021-06-14 LAB — URINE CYTOLOGY ANCILLARY ONLY
Chlamydia: NEGATIVE
Comment: NEGATIVE
Comment: NORMAL
Neisseria Gonorrhea: NEGATIVE

## 2021-06-14 LAB — RPR: RPR Ser Ql: NONREACTIVE

## 2021-06-14 LAB — HIV ANTIBODY (ROUTINE TESTING W REFLEX): HIV 1&2 Ab, 4th Generation: NONREACTIVE

## 2021-06-15 ENCOUNTER — Other Ambulatory Visit (HOSPITAL_COMMUNITY): Payer: Self-pay

## 2021-07-04 ENCOUNTER — Other Ambulatory Visit (HOSPITAL_COMMUNITY): Payer: Self-pay

## 2021-07-11 ENCOUNTER — Other Ambulatory Visit (HOSPITAL_COMMUNITY): Payer: Self-pay

## 2021-08-03 ENCOUNTER — Other Ambulatory Visit (HOSPITAL_COMMUNITY): Payer: Self-pay

## 2021-08-05 ENCOUNTER — Other Ambulatory Visit (HOSPITAL_COMMUNITY): Payer: Self-pay

## 2021-08-29 ENCOUNTER — Other Ambulatory Visit (HOSPITAL_COMMUNITY): Payer: Self-pay

## 2021-09-12 ENCOUNTER — Ambulatory Visit (INDEPENDENT_AMBULATORY_CARE_PROVIDER_SITE_OTHER): Payer: 59 | Admitting: Pharmacist

## 2021-09-12 ENCOUNTER — Other Ambulatory Visit: Payer: Self-pay

## 2021-09-12 ENCOUNTER — Other Ambulatory Visit (HOSPITAL_COMMUNITY)
Admission: RE | Admit: 2021-09-12 | Discharge: 2021-09-12 | Disposition: A | Discharge status: Home / Self Care | Payer: 59 | Source: Ambulatory Visit | Attending: Infectious Disease | Admitting: Infectious Disease

## 2021-09-12 DIAGNOSIS — Z113 Encounter for screening for infections with a predominantly sexual mode of transmission: Secondary | ICD-10-CM

## 2021-09-12 DIAGNOSIS — Z79899 Other long term (current) drug therapy: Secondary | ICD-10-CM

## 2021-09-12 NOTE — Progress Notes (Cosign Needed Addendum)
Date:  09/12/2021   HPI: Frank Hancock is a 36 y.o. male who presents to the [facility] clinic for HIV PrEP follow-up.  Insured   [x]    Uninsured  []    Patient Active Problem List   Diagnosis Date Noted   Annual physical exam 12/21/2015   PCP NOTES >>>>>>>>>>>>>>>>>>>>> 10/01/2015   Anxiety state 10/01/2015   Dyspnea 06/24/2014   Chest pain 06/24/2014   Palpitations 06/24/2014    Patient's Medications  New Prescriptions   No medications on file  Previous Medications   ASPIRIN-ACETAMINOPHEN-CAFFEINE (EXCEDRIN PO)    Take 1 tablet by mouth as needed (Headaches).   EMTRICITABINE-TENOFOVIR (TRUVADA) 200-300 MG TABLET    Take 1 tablet by mouth daily.  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 - [employer]    Comment: Computer  Tobacco Use   Smoking status: Never   Smokeless tobacco: Never  Substance and Sexual Activity   Alcohol use: Yes    Alcohol/week: 0.0 standard drinks of alcohol    Comment: Occasional   Drug use: No   Sexual activity: Not on file  Other Topics Concern   Not on file  Social History Narrative   Born in [birth year][place]26/2015   Moved from [place] 2016, lives w/ mother    Social Determinants of Health   Financial Resource Strain: Not on file  Food Insecurity: Not on file  Transportation Needs: Not on file  Physical Activity: Not on file  Stress: Not on file  Social Connections: Not on file        08/05/2018    3:18 PM 02/04/2018   10:55 AM 11/07/2017   11:39 AM 10/04/2017   11:41 AM  CH[facility] HIV PREP FLOWSHEET RESULTS  Insurance Status Insured Insured Insured Insured  Gender at birth Male Male Male Male  Gender identity cis-Male cis-Male cis-Male cis-Male  Risk for HIV Hx of STI  Condomless vaginal or anal intercourse   Sex Partners Women only Women only Women only Women only  # sex partners past 3-6 mos   1-3 1-3  Sex activity preferences  Insertive Insertive   Condom use   Yes Yes  % condom use   90 90  Treated for STI? No No  No  HIV symptoms? N/A N/A N/A N/A  PrEP Eligibility Substantial risk for HIV       Labs:  SCr: Lab Results  Component Value Date   CREATININE 0.93 03/15/2021   CREATININE 0.81 05/14/2018   CREATININE 0.88 11/07/2017   CREATININE 0.81 10/04/2017   CREATININE 0.85 01/04/2017   HIV Lab Results  Component Value Date   HIV NON-REACTIVE 06/13/2021   HIV NON-REACTIVE 03/15/2021   HIV NON-REACTIVE 08/05/2018   HIV NON-REACTIVE 05/14/2018   HIV NON-REACTIVE 02/04/2018   Hepatitis B Lab Results  Component Value Date   HEPBSAB REACTIVE (A) 08/05/2018   HEPBSAG NON-REACTIVE 10/04/2017   Hepatitis C Lab Results  Component Value Date   HEPCAB NON-REACTIVE 10/04/2017   Hepatitis A Lab Results  Component Value Date   HAV REACTIVE (A) 10/04/2017   RPR and STI Lab Results  Component Value Date   LABRPR NON-REACTIVE 06/13/2021   LABRPR NON-REACTIVE 03/15/2021   LABRPR NON-REACTIVE 11/07/2017   LABRPR NON-REACTIVE 10/04/2017   LABRPR NON-REACTIVE 01/04/2017    STI Results GC CT  06/13/2021  2:50 PM Negative  Negative   03/15/2021  2:14 PM Negative  Negative   11/07/2017 12:00 AM Negative  Negative   10/04/2017 12:00 AM Negative  Negative     Assessment: Frank Hancock is here today to follow up for HIV PrEP. Continues on Truvada with no issues. Nausea has resolved and he has no further side effects. He only has one partner. Requesting STI check today.  Has a week left of medication. Will check and see him back in 3 months.   Plan: - HIV antibody, RPR, urine cytology today - Truvada x 3 months if HIV negative - F/u with me again in October  Namish Krise L. Eryanna Regal, PharmD, BCIDP, AAHIVP, CPP Clinical Pharmacist Practitioner Infectious Diseases Clinical Pharmacist [facility] 09/12/2021, 2:35 PM

## 2021-09-13 ENCOUNTER — Other Ambulatory Visit (HOSPITAL_COMMUNITY): Payer: Self-pay

## 2021-09-13 ENCOUNTER — Other Ambulatory Visit: Payer: Self-pay | Admitting: Pharmacist

## 2021-09-13 DIAGNOSIS — Z79899 Other long term (current) drug therapy: Secondary | ICD-10-CM

## 2021-09-13 LAB — URINE CYTOLOGY ANCILLARY ONLY
Chlamydia: NEGATIVE
Comment: NEGATIVE
Comment: NORMAL
Neisseria Gonorrhea: NEGATIVE

## 2021-09-13 LAB — HIV ANTIBODY (ROUTINE TESTING W REFLEX): HIV 1&2 Ab, 4th Generation: NONREACTIVE

## 2021-09-13 LAB — RPR: RPR Ser Ql: NONREACTIVE

## 2021-09-13 MED ORDER — EMTRICITABINE-TENOFOVIR DF 200-300 MG PO TABS
1.0000 | ORAL_TABLET | Freq: Every day | ORAL | 2 refills | Status: DC
  Filled 2021-09-13: qty 30, 30d supply, fill #0
  Filled 2021-10-06: qty 30, 30d supply, fill #1
  Filled 2021-11-01: qty 30, 30d supply, fill #2

## 2021-09-14 ENCOUNTER — Other Ambulatory Visit (HOSPITAL_COMMUNITY): Payer: Self-pay

## 2021-10-06 ENCOUNTER — Other Ambulatory Visit (HOSPITAL_COMMUNITY): Payer: Self-pay

## 2021-10-11 ENCOUNTER — Other Ambulatory Visit (HOSPITAL_COMMUNITY): Payer: Self-pay

## 2021-11-01 ENCOUNTER — Other Ambulatory Visit (HOSPITAL_COMMUNITY): Payer: Self-pay

## 2021-11-09 ENCOUNTER — Other Ambulatory Visit (HOSPITAL_COMMUNITY): Payer: Self-pay

## 2021-12-02 ENCOUNTER — Other Ambulatory Visit (HOSPITAL_COMMUNITY): Payer: Self-pay

## 2021-12-02 ENCOUNTER — Other Ambulatory Visit: Payer: Self-pay | Admitting: Pharmacist

## 2021-12-02 DIAGNOSIS — Z79899 Other long term (current) drug therapy: Secondary | ICD-10-CM

## 2021-12-08 ENCOUNTER — Other Ambulatory Visit (HOSPITAL_COMMUNITY): Payer: Self-pay

## 2021-12-12 ENCOUNTER — Other Ambulatory Visit: Payer: Self-pay

## 2021-12-12 ENCOUNTER — Ambulatory Visit (INDEPENDENT_AMBULATORY_CARE_PROVIDER_SITE_OTHER): Payer: 59 | Admitting: Pharmacist

## 2021-12-12 ENCOUNTER — Other Ambulatory Visit (HOSPITAL_COMMUNITY): Payer: Self-pay

## 2021-12-12 DIAGNOSIS — Z79899 Other long term (current) drug therapy: Secondary | ICD-10-CM

## 2021-12-12 DIAGNOSIS — Z23 Encounter for immunization: Secondary | ICD-10-CM

## 2021-12-12 MED ORDER — EMTRICITABINE-TENOFOVIR DF 200-300 MG PO TABS
1.0000 | ORAL_TABLET | Freq: Every day | ORAL | 2 refills | Status: DC
  Filled 2021-12-12: qty 30, 30d supply, fill #0
  Filled 2022-01-09: qty 30, 30d supply, fill #1
  Filled 2022-02-06 – 2022-02-08 (×2): qty 30, 30d supply, fill #2

## 2021-12-12 NOTE — Progress Notes (Unsigned)
Date:  12/12/2021   HPI: Frank Hancock is a 36 y.o. male who presents to the [facility] for HIV PrEP follow-up.  Insured   [x]    Uninsured  []    Patient Active Problem List   Diagnosis Date Noted   Annual physical exam 12/21/2015   PCP NOTES >>>>>>>>>>>>>>>>>>>>> 10/01/2015   Anxiety state 10/01/2015   Dyspnea 06/24/2014   Chest pain 06/24/2014   Palpitations 06/24/2014    Patient's Medications  New Prescriptions   No medications on file  Previous Medications   ASPIRIN-ACETAMINOPHEN-CAFFEINE (EXCEDRIN PO)    Take 1 tablet by mouth as needed (Headaches).   EMTRICITABINE-TENOFOVIR (TRUVADA) 200-300 MG TABLET    Take 1 tablet by mouth daily.  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  Tobacco Use   Smoking status: Never   Smokeless tobacco: Never  Substance and Sexual Activity   Alcohol use: Yes    Alcohol/week: 0.0 standard drinks of alcohol    Comment: Occasional   Drug use: No   Sexual activity: Not on file  Other Topics Concern   Not on file  Social History Narrative   Born in [place]   Moved from [place] 2016, lives w/ mother    Social Determinants of Health   Financial Resource Strain: Not on file  Food Insecurity: Not on file  Transportation Needs: Not on file  Physical Activity: Not on file  Stress: Not on file  Social Connections: Not on file        08/05/2018    3:18 PM 02/04/2018   10:55 AM 11/07/2017   11:39 AM 10/04/2017   11:41 AM  CHL HIV PREP FLOWSHEET RESULTS  Insurance Status Insured Insured Insured Insured  Gender at birth Male Male Male Male  Gender identity cis-Male cis-Male cis-Male cis-Male  Risk for HIV Hx of STI  Condomless vaginal or anal intercourse   Sex Partners Women only Women only Women only Women only  # sex partners past 3-6 mos   1-3 1-3  Sex activity preferences  Insertive Insertive   Condom use   Yes Yes  % condom use   90 90  Treated for STI? No No  No  HIV symptoms? N/A N/A N/A N/A  PrEP Eligibility Substantial risk for HIV       Labs:  SCr: Lab Results  Component Value Date   CREATININE 0.93 03/15/2021   CREATININE 0.81 05/14/2018   CREATININE 0.88 11/07/2017   CREATININE 0.81 10/04/2017   CREATININE 0.85 01/04/2017   HIV Lab Results  Component Value Date   HIV NON-REACTIVE 09/12/2021   HIV NON-REACTIVE 06/13/2021   HIV NON-REACTIVE 03/15/2021   HIV NON-REACTIVE 08/05/2018   HIV NON-REACTIVE 05/14/2018   Hepatitis B Lab Results  Component Value Date   HEPBSAB REACTIVE (A) 08/05/2018   HEPBSAG NON-REACTIVE 10/04/2017   Hepatitis C Lab Results  Component Value Date   HEPCAB NON-REACTIVE 10/04/2017   Hepatitis A Lab Results  Component Value Date   HAV REACTIVE (A) 10/04/2017   RPR and STI Lab Results  Component Value Date   LABRPR NON-REACTIVE 09/12/2021   LABRPR NON-REACTIVE 06/13/2021   LABRPR NON-REACTIVE 03/15/2021   LABRPR NON-REACTIVE 11/07/2017   LABRPR NON-REACTIVE 10/04/2017    STI Results GC CT  09/12/2021  2:36 PM Negative  Negative   06/13/2021  2:50 PM Negative  Negative   03/15/2021  2:14 PM Negative  Negative   11/07/2017 12:00 AM Negative  Negative   10/04/2017 12:00 AM Negative  Negative     Assessment: Frank Hancock presents today for follow up for HIV PrEP. Continues on Truvada without issues. He does not have any new partners. He has 3 tablets left. He also  shares that he had the flu about 2 weeks ago but never officially tested positive, but did experience flu-like symptoms. He asked if he should still get the flu shot today. We recommended he still receive the flu vaccine. He also mentioned that since he has been with the same partner for a while now he is thinking about stopping PrEP in a few months.   Plan: -Obtain HIV antibody -Sent refill for Truvada so that he does not run out while labs are pending -Follow up scheduled for 03/13/22  Eliseo Gum, PharmD PGY1 Pharmacy Resident   12/12/2021  2:48 PM

## 2021-12-13 ENCOUNTER — Other Ambulatory Visit (HOSPITAL_COMMUNITY): Payer: Self-pay

## 2021-12-13 LAB — HIV ANTIBODY (ROUTINE TESTING W REFLEX): HIV 1&2 Ab, 4th Generation: NONREACTIVE

## 2021-12-15 ENCOUNTER — Encounter: Payer: Self-pay | Admitting: Pharmacist

## 2021-12-19 ENCOUNTER — Other Ambulatory Visit (HOSPITAL_COMMUNITY): Payer: Self-pay

## 2022-01-09 ENCOUNTER — Other Ambulatory Visit (HOSPITAL_COMMUNITY): Payer: Self-pay

## 2022-01-12 ENCOUNTER — Other Ambulatory Visit (HOSPITAL_COMMUNITY): Payer: Self-pay

## 2022-02-06 ENCOUNTER — Other Ambulatory Visit (HOSPITAL_COMMUNITY): Payer: Self-pay

## 2022-02-08 ENCOUNTER — Other Ambulatory Visit (HOSPITAL_COMMUNITY): Payer: Self-pay

## 2022-02-08 ENCOUNTER — Other Ambulatory Visit: Payer: Self-pay

## 2022-03-01 ENCOUNTER — Other Ambulatory Visit (HOSPITAL_COMMUNITY): Payer: Self-pay

## 2022-03-02 ENCOUNTER — Other Ambulatory Visit (HOSPITAL_COMMUNITY): Payer: Self-pay

## 2022-03-13 ENCOUNTER — Ambulatory Visit (INDEPENDENT_AMBULATORY_CARE_PROVIDER_SITE_OTHER): Payer: 59 | Admitting: Pharmacist

## 2022-03-13 ENCOUNTER — Other Ambulatory Visit (HOSPITAL_COMMUNITY)
Admission: RE | Admit: 2022-03-13 | Discharge: 2022-03-13 | Disposition: A | Discharge status: Home / Self Care | Payer: 59 | Source: Ambulatory Visit | Attending: Infectious Disease | Admitting: Infectious Disease

## 2022-03-13 ENCOUNTER — Other Ambulatory Visit: Payer: Self-pay

## 2022-03-13 DIAGNOSIS — Z2981 Encounter for HIV pre-exposure prophylaxis: Secondary | ICD-10-CM

## 2022-03-13 DIAGNOSIS — Z113 Encounter for screening for infections with a predominantly sexual mode of transmission: Secondary | ICD-10-CM | POA: Insufficient documentation

## 2022-03-13 DIAGNOSIS — Z79899 Other long term (current) drug therapy: Secondary | ICD-10-CM

## 2022-03-13 NOTE — Progress Notes (Signed)
Date:  03/13/2022   HPI: Frank Hancock is a 37 y.o. male who presents to the [facility] for HIV PrEP follow-up.  Insured   [x]    Uninsured  []    Patient Active Problem List   Diagnosis Date Noted   Annual physical exam 12/21/2015   PCP NOTES >>>>>>>>>>>>>>>>>>>>> 10/01/2015   Anxiety state 10/01/2015   Dyspnea 06/24/2014   Chest pain 06/24/2014   Palpitations 06/24/2014    Patient's Medications  New Prescriptions   No medications on file  Previous Medications   ASPIRIN-ACETAMINOPHEN-CAFFEINE (EXCEDRIN PO)    Take 1 tablet by mouth as needed (Headaches).   EMTRICITABINE-TENOFOVIR (TRUVADA) 200-300 MG TABLET    Take 1 tablet by mouth daily.  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  Tobacco Use   Smoking status: Never   Smokeless tobacco: Never  Substance and Sexual Activity   Alcohol use: Yes    Alcohol/week: 0.0 standard drinks of alcohol    Comment: Occasional   Drug use: No   Sexual activity: Not on file  Other Topics Concern   Not on file  Social History Narrative   Born in [place]   Moved from [place] 2016, lives w/ mother    Social Determinants of Health   Financial Resource Strain: Not on file  Food Insecurity: Not on file  Transportation Needs: Not on file  Physical Activity: Not on file  Stress: Not on file  Social Connections: Not on file        08/05/2018    3:18 PM 02/04/2018   10:55 AM 11/07/2017   11:39 AM 10/04/2017   11:41 AM  CHL HIV PREP FLOWSHEET RESULTS  Insurance Status Insured Insured Insured Insured  Gender at birth Male Male Male Male  Gender identity cis-Male cis-Male cis-Male cis-Male  Risk for HIV Hx of STI  Condomless vaginal or anal intercourse   Sex Partners Women only Women only Women only Women only  # sex partners past 3-6 mos   1-3 1-3  Sex activity preferences  Insertive Insertive   Condom use   Yes Yes  % condom use   90 90  Treated for STI? No No  No  HIV symptoms? N/A N/A N/A N/A  PrEP Eligibility Substantial risk for HIV       Labs:  SCr: Lab Results  Component Value Date   CREATININE 0.93 03/15/2021   CREATININE 0.81 05/14/2018   CREATININE 0.88 11/07/2017   CREATININE 0.81 10/04/2017   CREATININE 0.85 01/04/2017   HIV Lab Results  Component Value Date   HIV NON-REACTIVE 12/12/2021   HIV NON-REACTIVE 09/12/2021   HIV NON-REACTIVE 06/13/2021   HIV NON-REACTIVE 03/15/2021   HIV NON-REACTIVE 08/05/2018   Hepatitis B Lab Results  Component Value Date   HEPBSAB REACTIVE (A) 08/05/2018   HEPBSAG NON-REACTIVE 10/04/2017   Hepatitis C Lab Results  Component Value Date   HEPCAB NON-REACTIVE 10/04/2017   Hepatitis A Lab Results  Component Value Date   HAV REACTIVE (A) 10/04/2017   RPR and STI Lab Results  Component Value Date   LABRPR NON-REACTIVE 09/12/2021   LABRPR NON-REACTIVE 06/13/2021   LABRPR NON-REACTIVE 03/15/2021   LABRPR NON-REACTIVE 11/07/2017   LABRPR NON-REACTIVE 10/04/2017    STI Results GC CT  09/12/2021  2:36 PM Negative  Negative   06/13/2021  2:50 PM Negative  Negative   03/15/2021  2:14 PM Negative  Negative   11/07/2017 12:00 AM Negative  Negative   10/04/2017 12:00 AM Negative  Negative     Assessment: Frank Hancock is here for his 3 month oral PrEP follow up appointment. He is doing well on Truvada and states that he is not having any side effects associated  with taking it. No problems with the pharmacy and no changes with his insurance in 2024. He was thinking about stopping PrEP at his last appointment in October due to having a stable relationship but states that he wants to continue taking. Asking for STI testing so will order today. He only has male partners. Last BMP was checked in January 2023 so will check that as well. No issues. Asked to reach out if he needed anything.   Plan: - HIV antibody, BMP, RPR, and urine cytology today - Truvada x 3 months if HIV negative - F/u with me on 06/12/22  Frank Hancock L. Rosario Duey, PharmD, BCIDP, AAHIVP, CPP Clinical Pharmacist Practitioner Infectious Diseases Clinical Pharmacist [facility] 03/13/2022, 10:05 AM

## 2022-03-14 LAB — URINE CYTOLOGY ANCILLARY ONLY
Chlamydia: NEGATIVE
Comment: NEGATIVE
Comment: NORMAL
Neisseria Gonorrhea: NEGATIVE

## 2022-03-14 LAB — BASIC METABOLIC PANEL
BUN: 12 mg/dL (ref 7–25)
CO2: 24 mmol/L (ref 20–32)
Calcium: 10 mg/dL (ref 8.6–10.3)
Chloride: 104 mmol/L (ref 98–110)
Creat: 0.84 mg/dL (ref 0.60–1.26)
Glucose, Bld: 98 mg/dL (ref 65–99)
Potassium: 4.2 mmol/L (ref 3.5–5.3)
Sodium: 138 mmol/L (ref 135–146)

## 2022-03-14 LAB — HIV ANTIBODY (ROUTINE TESTING W REFLEX): HIV 1&2 Ab, 4th Generation: NONREACTIVE

## 2022-03-14 LAB — RPR: RPR Ser Ql: NONREACTIVE

## 2022-03-15 ENCOUNTER — Other Ambulatory Visit (HOSPITAL_COMMUNITY): Payer: Self-pay

## 2022-03-15 ENCOUNTER — Other Ambulatory Visit: Payer: Self-pay

## 2022-03-17 ENCOUNTER — Other Ambulatory Visit (HOSPITAL_COMMUNITY): Payer: Self-pay

## 2022-03-17 ENCOUNTER — Other Ambulatory Visit: Payer: Self-pay | Admitting: Pharmacist

## 2022-03-17 DIAGNOSIS — Z79899 Other long term (current) drug therapy: Secondary | ICD-10-CM

## 2022-03-17 MED ORDER — EMTRICITABINE-TENOFOVIR DF 200-300 MG PO TABS
1.0000 | ORAL_TABLET | Freq: Every day | ORAL | 2 refills | Status: DC
  Filled 2022-03-17: qty 30, 30d supply, fill #0
  Filled 2022-04-06: qty 30, 30d supply, fill #1
  Filled 2022-05-09: qty 30, 30d supply, fill #2

## 2022-04-06 ENCOUNTER — Other Ambulatory Visit (HOSPITAL_COMMUNITY): Payer: Self-pay

## 2022-04-13 ENCOUNTER — Other Ambulatory Visit: Payer: Self-pay

## 2022-05-09 ENCOUNTER — Other Ambulatory Visit (HOSPITAL_COMMUNITY): Payer: Self-pay

## 2022-05-10 ENCOUNTER — Other Ambulatory Visit (HOSPITAL_COMMUNITY): Payer: Self-pay

## 2022-06-07 ENCOUNTER — Other Ambulatory Visit (HOSPITAL_COMMUNITY): Payer: Self-pay

## 2022-06-12 ENCOUNTER — Other Ambulatory Visit: Payer: Self-pay

## 2022-06-12 ENCOUNTER — Other Ambulatory Visit (HOSPITAL_COMMUNITY): Payer: Self-pay

## 2022-06-12 ENCOUNTER — Other Ambulatory Visit (HOSPITAL_COMMUNITY)
Admission: RE | Admit: 2022-06-12 | Discharge: 2022-06-12 | Disposition: A | Discharge status: Home / Self Care | Payer: 59 | Source: Ambulatory Visit | Attending: Infectious Disease | Admitting: Infectious Disease

## 2022-06-12 ENCOUNTER — Ambulatory Visit (INDEPENDENT_AMBULATORY_CARE_PROVIDER_SITE_OTHER): Payer: 59 | Admitting: Pharmacist

## 2022-06-12 DIAGNOSIS — Z113 Encounter for screening for infections with a predominantly sexual mode of transmission: Secondary | ICD-10-CM | POA: Insufficient documentation

## 2022-06-12 DIAGNOSIS — Z79899 Other long term (current) drug therapy: Secondary | ICD-10-CM

## 2022-06-12 DIAGNOSIS — Z2981 Encounter for HIV pre-exposure prophylaxis: Secondary | ICD-10-CM | POA: Diagnosis not present

## 2022-06-12 NOTE — Progress Notes (Signed)
Date:  06/12/2022   HPI: Frank Hancock is a 37 y.o. male who presents to the [facility] clinic for HIV PrEP follow-up.  Insured   [x]    Uninsured  []    Patient Active Problem List   Diagnosis Date Noted   Annual physical exam 12/21/2015   PCP NOTES >>>>>>>>>>>>>>>>>>>>> 10/01/2015   Anxiety state 10/01/2015   Dyspnea 06/24/2014   Chest pain 06/24/2014   Palpitations 06/24/2014    Patient's Medications  New Prescriptions   No medications on file  Previous Medications   ASPIRIN-ACETAMINOPHEN-CAFFEINE (EXCEDRIN PO)    Take 1 tablet by mouth as needed (Headaches).   EMTRICITABINE-TENOFOVIR (TRUVADA) 200-300 MG TABLET    Take 1 tablet by mouth daily.  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  Tobacco Use   Smoking status: Never   Smokeless tobacco: Never  Substance and Sexual Activity   Alcohol use: Yes    Alcohol/week: 0.0 standard drinks of alcohol    Comment: Occasional   Drug use: No   Sexual activity: Not on file  Other Topics Concern   Not on file  Social History Narrative   Born in [place]   Moved from [place] 2016, lives w/ mother    Social Determinants of Health   Financial Resource Strain: Not on file  Food Insecurity: Not on file  Transportation Needs: Not on file  Physical Activity: Not on file  Stress: Not on file  Social Connections: Not on file        08/05/2018    3:18 PM 02/04/2018   10:55 AM 11/07/2017   11:39 AM 10/04/2017   11:41 AM  CHL HIV PREP FLOWSHEET RESULTS  Insurance Status Insured Insured Insured Insured  Gender at birth Male Male Male Male  Gender identity cis-Male cis-Male cis-Male cis-Male  Risk for HIV Hx of STI  Condomless vaginal or anal intercourse   Sex Partners Women only Women only Women only Women only  # sex partners past 3-6 mos   1-3 1-3  Sex activity preferences  Insertive Insertive   Condom use   Yes Yes  % condom use   90 90  Treated for STI? No No  No  HIV symptoms? N/A N/A N/A N/A  PrEP Eligibility Substantial risk for HIV       Labs:  SCr: Lab Results  Component Value Date   CREATININE 0.84 03/13/2022   CREATININE 0.93 03/15/2021   CREATININE 0.81 05/14/2018   CREATININE 0.88 11/07/2017   CREATININE 0.81 10/04/2017   HIV Lab Results  Component Value Date   HIV NON-REACTIVE 03/13/2022   HIV NON-REACTIVE 12/12/2021   HIV NON-REACTIVE 09/12/2021   HIV NON-REACTIVE 06/13/2021   HIV NON-REACTIVE 03/15/2021   Hepatitis B Lab Results  Component Value Date   HEPBSAB REACTIVE (A) 08/05/2018   HEPBSAG NON-REACTIVE 10/04/2017   Hepatitis C Lab Results  Component Value Date   HEPCAB NON-REACTIVE 10/04/2017   Hepatitis A Lab Results  Component Value Date   HAV REACTIVE (A) 10/04/2017   RPR and STI Lab Results  Component Value Date   LABRPR NON-REACTIVE 03/13/2022   LABRPR NON-REACTIVE 09/12/2021   LABRPR NON-REACTIVE 06/13/2021   LABRPR NON-REACTIVE 03/15/2021   LABRPR NON-REACTIVE 11/07/2017    STI Results GC CT  03/13/2022 10:08 AM Negative  Negative   09/12/2021  2:36 PM Negative  Negative   06/13/2021  2:50 PM Negative  Negative   03/15/2021  2:14 PM Negative  Negative   11/07/2017 12:00 AM Negative  Negative   10/04/2017 12:00 AM Negative  Negative     Assessment: Frank Hancock is here for his 3 month oral PrEP follow up appointment. He is doing well on Truvada and wishes to  continue the medicine.   Patient would like STI testing today.  Urine cytology and RPR ordered. No rectal or oral cytology indicated as he only has male partners. Patient states that he received an updated COVID vaccine around January 2024 at [facility] so will update this in our vaccine records, although this vaccine is not displaying in [facility] [place]. Will order a routine HIV antibody.   Plan: - Follow up HIV antibody, RPR, and urine cytology today - Truvada x 3 months if HIV negative - Follow up with Cassie on July 8th, 2024   Blane Ohara, PharmD  PGY1 Pharmacy Resident

## 2022-06-13 LAB — URINE CYTOLOGY ANCILLARY ONLY
Chlamydia: NEGATIVE
Comment: NEGATIVE
Comment: NORMAL
Neisseria Gonorrhea: NEGATIVE

## 2022-06-13 LAB — RPR: RPR Ser Ql: NONREACTIVE

## 2022-06-13 LAB — HIV ANTIBODY (ROUTINE TESTING W REFLEX): HIV 1&2 Ab, 4th Generation: NONREACTIVE

## 2022-06-14 ENCOUNTER — Other Ambulatory Visit (HOSPITAL_COMMUNITY): Payer: Self-pay

## 2022-06-14 ENCOUNTER — Other Ambulatory Visit: Payer: Self-pay

## 2022-06-14 DIAGNOSIS — Z79899 Other long term (current) drug therapy: Secondary | ICD-10-CM

## 2022-06-14 MED ORDER — EMTRICITABINE-TENOFOVIR DF 200-300 MG PO TABS
1.0000 | ORAL_TABLET | Freq: Every day | ORAL | 2 refills | Status: DC
Start: 2022-06-14 — End: 2022-09-12
  Filled 2022-06-14 (×2): qty 30, 30d supply, fill #0
  Filled 2022-07-10: qty 30, 30d supply, fill #1
  Filled 2022-08-08: qty 30, 30d supply, fill #2

## 2022-07-10 ENCOUNTER — Other Ambulatory Visit (HOSPITAL_COMMUNITY): Payer: Self-pay

## 2022-07-13 ENCOUNTER — Other Ambulatory Visit: Payer: Self-pay

## 2022-08-08 ENCOUNTER — Other Ambulatory Visit (HOSPITAL_COMMUNITY): Payer: Self-pay

## 2022-08-09 ENCOUNTER — Other Ambulatory Visit (HOSPITAL_COMMUNITY): Payer: Self-pay

## 2022-08-30 ENCOUNTER — Other Ambulatory Visit: Payer: Self-pay

## 2022-08-30 ENCOUNTER — Other Ambulatory Visit: Payer: Self-pay | Admitting: Pharmacist

## 2022-08-30 DIAGNOSIS — Z79899 Other long term (current) drug therapy: Secondary | ICD-10-CM

## 2022-08-31 ENCOUNTER — Other Ambulatory Visit (HOSPITAL_COMMUNITY): Payer: Self-pay

## 2022-09-11 ENCOUNTER — Other Ambulatory Visit (HOSPITAL_COMMUNITY): Payer: Self-pay

## 2022-09-11 ENCOUNTER — Other Ambulatory Visit: Payer: Self-pay

## 2022-09-11 ENCOUNTER — Ambulatory Visit: Payer: 59 | Admitting: Pharmacist

## 2022-09-11 DIAGNOSIS — Z79899 Other long term (current) drug therapy: Secondary | ICD-10-CM

## 2022-09-11 DIAGNOSIS — Z2981 Encounter for HIV pre-exposure prophylaxis: Secondary | ICD-10-CM

## 2022-09-11 DIAGNOSIS — Z113 Encounter for screening for infections with a predominantly sexual mode of transmission: Secondary | ICD-10-CM

## 2022-09-11 NOTE — Progress Notes (Signed)
Date:  09/11/2022   HPI: Frank Hancock is a 37 y.o. male who presents to the [facility] clinic for HIV PrEP follow-up.  Insured   [x]    Uninsured  []    Patient Active Problem List   Diagnosis Date Noted   Annual physical exam 12/21/2015   PCP NOTES >>>>>>>>>>>>>>>>>>>>> 10/01/2015   Anxiety state 10/01/2015   Dyspnea 06/24/2014   Chest pain 06/24/2014   Palpitations 06/24/2014    Patient's Medications  New Prescriptions   No medications on file  Previous Medications   ASPIRIN-ACETAMINOPHEN-CAFFEINE (EXCEDRIN PO)    Take 1 tablet by mouth as needed (Headaches).   EMTRICITABINE-TENOFOVIR (TRUVADA) 200-300 MG TABLET    Take 1 tablet by mouth daily.  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  Tobacco Use   Smoking status: Never   Smokeless tobacco: Never  Substance and Sexual Activity   Alcohol use: Yes    Alcohol/week: 0.0 standard drinks of alcohol    Comment: Occasional   Drug use: No   Sexual activity: Not on file  Other Topics Concern   Not on file  Social History Narrative   Born in [place]   Moved from [place] 2016, lives w/ mother    Social Determinants of Health   Financial Resource Strain: Not on file  Food Insecurity: Not on file  Transportation Needs: Not on file  Physical Activity: Not on file  Stress: Not on file  Social Connections: Not on file        08/05/2018    3:18 PM 02/04/2018   10:55 AM 11/07/2017   11:39 AM 10/04/2017   11:41 AM  CHL HIV PREP FLOWSHEET RESULTS  Insurance Status Insured Insured Insured Insured  Gender at birth Male Male Male Male  Gender identity cis-Male cis-Male cis-Male cis-Male  Risk for HIV Hx of STI  Condomless vaginal or anal intercourse   Sex Partners Women only Women only Women only Women only  # sex partners past 3-6 mos   1-3 1-3  Sex activity preferences  Insertive Insertive   Condom use   Yes Yes  % condom use   90 90  Treated for STI? No No  No  HIV symptoms? N/A N/A N/A N/A  PrEP Eligibility Substantial risk for HIV       Labs:  SCr: Lab Results  Component Value Date   CREATININE 0.84 03/13/2022   CREATININE 0.93 03/15/2021   CREATININE 0.81 05/14/2018   CREATININE 0.88 11/07/2017   CREATININE 0.81 10/04/2017   HIV Lab Results  Component Value Date   HIV NON-REACTIVE 06/12/2022   HIV NON-REACTIVE 03/13/2022   HIV NON-REACTIVE 12/12/2021   HIV NON-REACTIVE 09/12/2021   HIV NON-REACTIVE 06/13/2021   Hepatitis B Lab Results  Component Value Date   HEPBSAB REACTIVE (A) 08/05/2018   HEPBSAG NON-REACTIVE 10/04/2017   Hepatitis C Lab Results  Component Value Date   HEPCAB NON-REACTIVE 10/04/2017   Hepatitis A Lab Results  Component Value Date   HAV REACTIVE (A) 10/04/2017   RPR and STI Lab Results  Component Value Date   LABRPR NON-REACTIVE 06/12/2022   LABRPR NON-REACTIVE 03/13/2022   LABRPR NON-REACTIVE 09/12/2021   LABRPR NON-REACTIVE 06/13/2021   LABRPR NON-REACTIVE 03/15/2021    STI Results GC CT  06/12/2022 10:25 AM Negative  Negative   03/13/2022 10:08 AM Negative  Negative   09/12/2021  2:36 PM Negative  Negative   06/13/2021  2:50 PM Negative  Negative   03/15/2021  2:14 PM Negative  Negative   11/07/2017 12:00 AM Negative  Negative   10/04/2017 12:00 AM Negative  Negative     Assessment: Frank Hancock comes in today for his 3 month PrEP follow up. Continues to do  well on Truvada with zero issues. Fills at [facility] and has no problems receiving it. No exposures or symptoms of HIV or STIs but requesting STI testing today. He is interested in seeing if his insurance will cover Descovy. It will need a PA so we will move forward with that. Will see him back in 3 months.   Plan: - HIV antibody, RPR, and urine cytology today - Truvada x 3 months if HIV negative - PA for Descovy - F/u with me again on 12/11/22  Caitriona Sundquist L. Jannette [place], PharmD, BCIDP, AAHIVP, CPP Clinical Pharmacist Practitioner Infectious Diseases Clinical Pharmacist [facility] 09/11/2022, 10:55 AM

## 2022-09-12 ENCOUNTER — Other Ambulatory Visit: Payer: Self-pay | Admitting: Pharmacist

## 2022-09-12 ENCOUNTER — Other Ambulatory Visit: Payer: Self-pay

## 2022-09-12 ENCOUNTER — Other Ambulatory Visit (HOSPITAL_COMMUNITY): Payer: Self-pay

## 2022-09-12 ENCOUNTER — Telehealth: Payer: Self-pay

## 2022-09-12 DIAGNOSIS — Z79899 Other long term (current) drug therapy: Secondary | ICD-10-CM

## 2022-09-12 LAB — C. TRACHOMATIS/N. GONORRHOEAE RNA
C. trachomatis RNA, TMA: NOT DETECTED
N. gonorrhoeae RNA, TMA: NOT DETECTED

## 2022-09-12 LAB — HIV ANTIBODY (ROUTINE TESTING W REFLEX): HIV 1&2 Ab, 4th Generation: NONREACTIVE

## 2022-09-12 LAB — RPR: RPR Ser Ql: NONREACTIVE

## 2022-09-12 MED ORDER — EMTRICITABINE-TENOFOVIR DF 200-300 MG PO TABS
1.0000 | ORAL_TABLET | Freq: Every day | ORAL | 2 refills | Status: DC
Start: 2022-09-12 — End: 2022-12-18
  Filled 2022-09-12: qty 30, 30d supply, fill #0
  Filled 2022-09-28: qty 30, 30d supply, fill #1
  Filled 2022-10-31: qty 30, 30d supply, fill #2

## 2022-09-12 NOTE — Telephone Encounter (Signed)
[facility] Patient Advocate Encounter   Received notification from [employer] that prior authorization for Descovy is required.   PA submitted on 09/12/2022 Key [id number] Status is pending    [facility] will continue to follow.   Clearance Coots, CPhT Specialty Pharmacy Patient [facility] for Infectious Disease Phone: [phone] Fax:  [phone]

## 2022-09-19 ENCOUNTER — Telehealth: Payer: Self-pay

## 2022-09-19 NOTE — Telephone Encounter (Signed)
[facility] Patient Advocate Encounter  Received notification from [facility] [employer] that the request for prior authorization for Descovy has been denied due to medication is not on the listing or formulary of approved drugs on the patient plan benefits, Please discuss alternative drug therapy..     Insurance will pay for Generic Truvada.  This encounter will continue to be updated until final determination.    Clearance Coots, CPhT Specialty Pharmacy Patient [facility] for Infectious Disease Phone: [phone] Fax:  [phone]

## 2022-09-21 ENCOUNTER — Encounter: Payer: Self-pay | Admitting: Pharmacist

## 2022-09-28 ENCOUNTER — Other Ambulatory Visit (HOSPITAL_COMMUNITY): Payer: Self-pay

## 2022-10-04 ENCOUNTER — Other Ambulatory Visit: Payer: Self-pay

## 2022-10-05 ENCOUNTER — Other Ambulatory Visit: Payer: Self-pay

## 2022-10-05 ENCOUNTER — Other Ambulatory Visit (HOSPITAL_COMMUNITY): Payer: Self-pay

## 2022-10-31 ENCOUNTER — Other Ambulatory Visit: Payer: Self-pay

## 2022-11-01 ENCOUNTER — Other Ambulatory Visit (HOSPITAL_COMMUNITY): Payer: Self-pay

## 2022-11-02 ENCOUNTER — Other Ambulatory Visit: Payer: Self-pay

## 2022-11-22 ENCOUNTER — Other Ambulatory Visit: Payer: Self-pay

## 2022-12-06 NOTE — Progress Notes (Deleted)
HPI: Frank Hancock is a 37 y.o. male who presents to the [facility] pharmacy clinic for HIV PrEP follow-up.  Insured   []    Uninsured  []    Patient Active Problem List   Diagnosis Date Noted   Annual physical exam 12/21/2015   PCP NOTES >>>>>>>>>>>>>>>>>>>>> 10/01/2015   Anxiety state 10/01/2015   Dyspnea 06/24/2014   Chest pain 06/24/2014   Palpitations 06/24/2014    Patient's Medications  New Prescriptions   No medications on file  Previous Medications   ASPIRIN-ACETAMINOPHEN-CAFFEINE (EXCEDRIN PO)    Take 1 tablet by mouth as needed (Headaches).   EMTRICITABINE-TENOFOVIR (TRUVADA) 200-300 MG TABLET    Take 1 tablet by mouth daily.  Modified Medications   No medications on file  Discontinued Medications   No medications on file       08/05/2018    3:18 PM 02/04/2018   10:55 AM 11/07/2017   11:39 AM 10/04/2017   11:41 AM  CH[facility] HIV PREP FLOWSHEET RESULTS  Insurance Status Insured Insured Insured Insured  Gender at birth Male Male Male Male  Gender identity cis-Male cis-Male cis-Male cis-Male  Risk for HIV Hx of STI  Condomless vaginal or anal intercourse   Sex Partners Women only Women only Women only Women only  # sex partners past 3-6 mos  -- 1-3 1-3  Sex activity preferences  Insertive Insertive   Condom use   Yes Yes  % condom use   90 90  Treated for STI? No No  No  HIV symptoms? N/A N/A N/A N/A  PrEP Eligibility Substantial risk for HIV       Labs:  SCr: Lab Results  Component Value Date   CREATININE 0.84 03/13/2022   CREATININE 0.93 03/15/2021   CREATININE 0.81 05/14/2018   CREATININE 0.88 11/07/2017   CREATININE 0.81 10/04/2017   HIV Lab Results  Component Value Date   HIV NON-REACTIVE 09/11/2022   HIV NON-REACTIVE 06/12/2022   HIV NON-REACTIVE 03/13/2022   HIV NON-REACTIVE 12/12/2021   HIV NON-REACTIVE 09/12/2021   Hepatitis B Lab Results  Component Value Date   HEPBSAB REACTIVE (A) 08/05/2018   HEPBSAG NON-REACTIVE 10/04/2017   Hepatitis C Lab  Results  Component Value Date   HEPCAB NON-REACTIVE 10/04/2017   Hepatitis A Lab Results  Component Value Date   HAV REACTIVE (A) 10/04/2017   RPR and STI Lab Results  Component Value Date   LABRPR NON-REACTIVE 09/11/2022   LABRPR NON-REACTIVE 06/12/2022   LABRPR NON-REACTIVE 03/13/2022   LABRPR NON-REACTIVE 09/12/2021   LABRPR NON-REACTIVE 06/13/2021    STI Results GC CT  06/12/2022 10:25 AM Negative  Negative   03/13/2022 10:08 AM Negative  Negative   09/12/2021  2:36 PM Negative  Negative   06/13/2021  2:50 PM Negative  Negative   03/15/2021  2:14 PM Negative  Negative   11/07/2017 12:00 AM Negative  Negative   10/04/2017 12:00 AM Negative  Negative     Assessment: Frank Hancock presents today for HIV PrEP follow up. Continues to take Truvada everyday without any issues or problems. We tried to switch him to Descovy but his insurance denied the request twice. Screened for acute HIV symptoms such as fatigue, muscle aches, rash, sore throat, lymphadenopathy, headache, night sweats, nausea/vomiting/diarrhea, and fever. Denies any symptoms. No new partners or concerns for STIs today but likes to get STI testing at every appointment to be safe.  Last BMP was checked in February 2024 and was normal. Will check labs and see him  back in 3 months.   Plan: - HIV antibody - Truvada x 3 months if negative - Follow up with me again on   Kmari Halter L. Rasheida Broden, PharmD, BCIDP, AAHIVP, CPP Clinical Pharmacist Practitioner Infectious Diseases Clinical Pharmacist [facility] 12/06/2022, 4:37 PM

## 2022-12-08 ENCOUNTER — Other Ambulatory Visit: Payer: Self-pay

## 2022-12-11 ENCOUNTER — Ambulatory Visit: Payer: 59 | Admitting: Pharmacist

## 2022-12-11 DIAGNOSIS — Z113 Encounter for screening for infections with a predominantly sexual mode of transmission: Secondary | ICD-10-CM

## 2022-12-11 DIAGNOSIS — Z79899 Other long term (current) drug therapy: Secondary | ICD-10-CM

## 2022-12-11 NOTE — Progress Notes (Unsigned)
HPI: Frank Hancock is a 37 y.o. male who presents to the [facility] pharmacy clinic for HIV PrEP follow-up.  Insured   [x]    Uninsured  []    Patient Active Problem List   Diagnosis Date Noted   Annual physical exam 12/21/2015   PCP NOTES >>>>>>>>>>>>>>>>>>>>> 10/01/2015   Anxiety state 10/01/2015   Dyspnea 06/24/2014   Chest pain 06/24/2014   Palpitations 06/24/2014    Patient's Medications  New Prescriptions   No medications on file  Previous Medications   ASPIRIN-ACETAMINOPHEN-CAFFEINE (EXCEDRIN PO)    Take 1 tablet by mouth as needed (Headaches).   EMTRICITABINE-TENOFOVIR (TRUVADA) 200-300 MG TABLET    Take 1 tablet by mouth daily.  Modified Medications   No medications on file  Discontinued Medications   No medications on file       08/05/2018    3:18 PM 02/04/2018   10:55 AM 11/07/2017   11:39 AM 10/04/2017   11:41 AM  CHL HIV PREP FLOWSHEET RESULTS  Insurance Status Insured Insured Insured Insured  Gender at birth Male Male Male Male  Gender identity cis-Male cis-Male cis-Male cis-Male  Risk for HIV Hx of STI  Condomless vaginal or anal intercourse   Sex Partners Women only Women only Women only Women only  # sex partners past 3-6 mos  -- 1-3 1-3  Sex activity preferences  Insertive Insertive   Condom use   Yes Yes  % condom use   90 90  Treated for STI? No No  No  HIV symptoms? N/A N/A N/A N/A  PrEP Eligibility Substantial risk for HIV       Labs:  SCr: Lab Results  Component Value Date   CREATININE 0.84 03/13/2022   CREATININE 0.93 03/15/2021   CREATININE 0.81 05/14/2018   CREATININE 0.88 11/07/2017   CREATININE 0.81 10/04/2017   HIV Lab Results  Component Value Date   HIV NON-REACTIVE 09/11/2022   HIV NON-REACTIVE 06/12/2022   HIV NON-REACTIVE 03/13/2022   HIV NON-REACTIVE 12/12/2021   HIV NON-REACTIVE 09/12/2021   Hepatitis B Lab Results  Component Value Date   HEPBSAB REACTIVE (A) 08/05/2018   HEPBSAG NON-REACTIVE 10/04/2017   Hepatitis C Lab  Results  Component Value Date   HEPCAB NON-REACTIVE 10/04/2017   Hepatitis A Lab Results  Component Value Date   HAV REACTIVE (A) 10/04/2017   RPR and STI Lab Results  Component Value Date   LABRPR NON-REACTIVE 09/11/2022   LABRPR NON-REACTIVE 06/12/2022   LABRPR NON-REACTIVE 03/13/2022   LABRPR NON-REACTIVE 09/12/2021   LABRPR NON-REACTIVE 06/13/2021    STI Results GC CT  06/12/2022 10:25 AM Negative  Negative   03/13/2022 10:08 AM Negative  Negative   09/12/2021  2:36 PM Negative  Negative   06/13/2021  2:50 PM Negative  Negative   03/15/2021  2:14 PM Negative  Negative   11/07/2017 12:00 AM Negative  Negative   10/04/2017 12:00 AM Negative  Negative     Assessment: Frank Hancock presents today for HIV PrEP follow up. Continues to take Truvada everyday without any issues or problems. We tried to switch him to Descovy but his insurance denied the request twice. Screened for acute HIV symptoms such as fatigue, muscle aches, rash, sore throat, lymphadenopathy, headache, night sweats, nausea/vomiting/diarrhea, and fever. Denies any symptoms. No new partners or concerns for STIs today but likes to get STI testing at every appointment to be safe.  Last BMP was checked in February 2024 and was normal. Will check labs and see him  back in 3 months.   Plan: - HIV antibody, RPR, and urine cytology today - Truvada x 3 months if negative - Follow up with me again on   Wilhelmine Krogstad L. Willet Schleifer, PharmD, BCIDP, AAHIVP, CPP Clinical Pharmacist Practitioner Infectious Diseases Clinical Pharmacist [facility] 12/11/2022, 2:14 PM

## 2022-12-12 ENCOUNTER — Other Ambulatory Visit (HOSPITAL_COMMUNITY): Payer: Self-pay

## 2022-12-13 ENCOUNTER — Other Ambulatory Visit: Payer: Self-pay

## 2022-12-13 ENCOUNTER — Other Ambulatory Visit (HOSPITAL_COMMUNITY)
Admission: RE | Admit: 2022-12-13 | Discharge: 2022-12-13 | Disposition: A | Discharge status: Home / Self Care | Payer: 59 | Source: Ambulatory Visit | Attending: Infectious Disease | Admitting: Infectious Disease

## 2022-12-13 ENCOUNTER — Other Ambulatory Visit (HOSPITAL_COMMUNITY): Payer: Self-pay

## 2022-12-13 ENCOUNTER — Ambulatory Visit (INDEPENDENT_AMBULATORY_CARE_PROVIDER_SITE_OTHER): Payer: 59 | Admitting: Pharmacist

## 2022-12-13 DIAGNOSIS — Z79899 Other long term (current) drug therapy: Secondary | ICD-10-CM | POA: Insufficient documentation

## 2022-12-13 DIAGNOSIS — Z113 Encounter for screening for infections with a predominantly sexual mode of transmission: Secondary | ICD-10-CM

## 2022-12-14 ENCOUNTER — Other Ambulatory Visit: Payer: Self-pay

## 2022-12-14 ENCOUNTER — Other Ambulatory Visit (HOSPITAL_COMMUNITY): Payer: Self-pay

## 2022-12-14 LAB — URINE CYTOLOGY ANCILLARY ONLY
Chlamydia: NEGATIVE
Comment: NEGATIVE
Comment: NORMAL
Neisseria Gonorrhea: NEGATIVE

## 2022-12-15 LAB — RPR: RPR Ser Ql: NONREACTIVE

## 2022-12-15 LAB — HIV-1 RNA QUANT-NO REFLEX-BLD
HIV 1 RNA Quant: NOT DETECTED {copies}/mL
HIV-1 RNA Quant, Log: NOT DETECTED {Log}

## 2022-12-15 LAB — HIV ANTIBODY (ROUTINE TESTING W REFLEX): HIV 1&2 Ab, 4th Generation: NONREACTIVE

## 2022-12-18 ENCOUNTER — Other Ambulatory Visit: Payer: Self-pay | Admitting: Pharmacist

## 2022-12-18 ENCOUNTER — Other Ambulatory Visit: Payer: Self-pay

## 2022-12-18 ENCOUNTER — Encounter: Payer: Self-pay | Admitting: Pharmacist

## 2022-12-18 ENCOUNTER — Other Ambulatory Visit (HOSPITAL_COMMUNITY): Payer: Self-pay

## 2022-12-18 DIAGNOSIS — Z79899 Other long term (current) drug therapy: Secondary | ICD-10-CM

## 2022-12-18 MED ORDER — EMTRICITABINE-TENOFOVIR DF 200-300 MG PO TABS
1.0000 | ORAL_TABLET | Freq: Every day | ORAL | 2 refills | Status: DC
Start: 2022-12-18 — End: 2023-06-13
  Filled 2022-12-18: qty 30, 30d supply, fill #0
  Filled 2023-01-15 (×3): qty 30, 30d supply, fill #1
  Filled 2023-01-31: qty 30, 30d supply, fill #2

## 2022-12-18 NOTE — Progress Notes (Signed)
Specialty Pharmacy Refill Coordination Note  Frank Hancock is a 37 y.o. male contacted today regarding refills of specialty medication(s) Emtricitabine-Tenofovir Df   Patient requested Delivery   Delivery date: 12/20/22   Verified address: [address], [place], [address]   Medication will be filled on 12/19/22.

## 2023-01-09 ENCOUNTER — Other Ambulatory Visit (HOSPITAL_COMMUNITY): Payer: Self-pay

## 2023-01-12 ENCOUNTER — Other Ambulatory Visit (HOSPITAL_COMMUNITY): Payer: Self-pay

## 2023-01-12 ENCOUNTER — Encounter (HOSPITAL_COMMUNITY): Payer: Self-pay

## 2023-01-15 ENCOUNTER — Other Ambulatory Visit: Payer: Self-pay

## 2023-01-15 NOTE — Progress Notes (Signed)
Specialty Pharmacy Refill Coordination Note  Frank Hancock is a 37 y.o. male contacted today regarding refills of specialty medication(s) Emtricitabine-Tenofovir Df   Patient requested Delivery   Delivery date: 01/16/23   Verified address: [address] [place] [address]   Medication will be filled on 01/15/23.

## 2023-01-31 ENCOUNTER — Other Ambulatory Visit (HOSPITAL_COMMUNITY): Payer: Self-pay

## 2023-01-31 ENCOUNTER — Other Ambulatory Visit: Payer: Self-pay

## 2023-01-31 NOTE — Progress Notes (Signed)
Specialty Pharmacy Ongoing Clinical Assessment Note  Frank Hancock is a 37 y.o. male who is being followed by the specialty pharmacy service for RxSp HIV PrEP   Patient's specialty medication(s) reviewed today: Emtricitabine-Tenofovir Df   Missed doses in the last 4 weeks: 0   Patient/Caregiver did not have any additional questions or concerns.   Therapeutic benefit summary: Patient is achieving benefit   Adverse events/side effects summary: No adverse events/side effects   Patient's therapy is appropriate to: Continue    Goals Addressed             This Visit's Progress    Achieve Undetectable HIV Viral Load < 20       Patient is on track. Patient will maintain adherence         Follow up:  6 months  Sophiamarie Nease [facility] Specialty Pharmacist

## 2023-01-31 NOTE — Progress Notes (Signed)
Specialty Pharmacy Refill Coordination Note  Frank Hancock is a 37 y.o. male contacted today regarding refills of specialty medication(s) Emtricitabine-Tenofovir Df   Patient requested Delivery   Delivery date: 02/08/23   Verified address: [address] [place] [address]   Medication will be filled on 02/07/2023.

## 2023-02-07 ENCOUNTER — Other Ambulatory Visit: Payer: Self-pay

## 2023-02-20 ENCOUNTER — Other Ambulatory Visit: Payer: Self-pay

## 2023-03-09 ENCOUNTER — Other Ambulatory Visit (HOSPITAL_COMMUNITY): Payer: Self-pay

## 2023-03-12 ENCOUNTER — Other Ambulatory Visit: Payer: Self-pay

## 2023-03-12 NOTE — Progress Notes (Deleted)
   HPI: Frank Hancock is a 38 y.o. male who presents to the [facility] pharmacy clinic for HIV PrEP follow-up.  Insured   []    Uninsured  []    Patient Active Problem List   Diagnosis Date Noted   Annual physical exam 12/21/2015   PCP NOTES >>>>>>>>>>>>>>>>>>>>> 10/01/2015   Anxiety state 10/01/2015   Dyspnea 06/24/2014   Chest pain 06/24/2014   Palpitations 06/24/2014    Patient's Medications  New Prescriptions   No medications on file  Previous Medications   ASPIRIN-ACETAMINOPHEN-CAFFEINE (EXCEDRIN PO)    Take 1 tablet by mouth as needed (Headaches).   EMTRICITABINE -TENOFOVIR  (TRUVADA ) 200-300 MG TABLET    Take 1 tablet by mouth daily.  Modified Medications   No medications on file  Discontinued Medications   No medications on file       08/05/2018    3:18 PM 02/04/2018   10:55 AM 11/07/2017   11:39 AM 10/04/2017   11:41 AM  CHL HIV PREP FLOWSHEET RESULTS  Insurance Status Insured Insured Insured Insured  Gender at birth Male Male Male Male  Gender identity cis-Male cis-Male cis-Male cis-Male  Risk for HIV Hx of STI  Condomless vaginal or anal intercourse   Sex Partners Women only Women only Women only Women only  # sex partners past 3-6 mos  -- 1-3 1-3  Sex activity preferences  Insertive Insertive   Condom use   Yes Yes  % condom use   90 90  Treated for STI? No No  No  HIV symptoms? N/A N/A N/A N/A  PrEP Eligibility Substantial risk for HIV       Labs:  SCr: Lab Results  Component Value Date   CREATININE 0.84 03/13/2022   CREATININE 0.93 03/15/2021   CREATININE 0.81 05/14/2018   CREATININE 0.88 11/07/2017   CREATININE 0.81 10/04/2017   HIV Lab Results  Component Value Date   HIV NON-REACTIVE 12/13/2022   HIV NON-REACTIVE 09/11/2022   HIV NON-REACTIVE 06/12/2022   HIV NON-REACTIVE 03/13/2022   HIV NON-REACTIVE 12/12/2021   Hepatitis B Lab Results  Component Value Date   HEPBSAB REACTIVE (A) 08/05/2018   HEPBSAG NON-REACTIVE 10/04/2017   Hepatitis C Lab  Results  Component Value Date   HEPCAB NON-REACTIVE 10/04/2017   Hepatitis A Lab Results  Component Value Date   HAV REACTIVE (A) 10/04/2017   RPR and STI Lab Results  Component Value Date   LABRPR NON-REACTIVE 12/13/2022   LABRPR NON-REACTIVE 09/11/2022   LABRPR NON-REACTIVE 06/12/2022   LABRPR NON-REACTIVE 03/13/2022   LABRPR NON-REACTIVE 09/12/2021    STI Results GC CT  12/13/2022  9:54 AM Negative  Negative   06/12/2022 10:25 AM Negative  Negative   03/13/2022 10:08 AM Negative  Negative   09/12/2021  2:36 PM Negative  Negative   06/13/2021  2:50 PM Negative  Negative   03/15/2021  2:14 PM Negative  Negative   11/07/2017 12:00 AM Negative  Negative   10/04/2017 12:00 AM Negative  Negative     Assessment: ***  Plan: ***  Katilynn Sinkler L. Amali Uhls, PharmD, BCIDP, AAHIVP, CPP Clinical Pharmacist Practitioner Infectious Diseases Clinical Pharmacist [facility] 03/12/2023, 4:00 PM

## 2023-03-13 ENCOUNTER — Ambulatory Visit: Payer: 59 | Admitting: Pharmacist

## 2023-03-13 DIAGNOSIS — Z79899 Other long term (current) drug therapy: Secondary | ICD-10-CM

## 2023-03-16 ENCOUNTER — Other Ambulatory Visit: Payer: Self-pay

## 2023-03-21 ENCOUNTER — Ambulatory Visit (INDEPENDENT_AMBULATORY_CARE_PROVIDER_SITE_OTHER): Payer: No Typology Code available for payment source | Admitting: Pharmacist

## 2023-03-21 ENCOUNTER — Other Ambulatory Visit: Payer: Self-pay

## 2023-03-21 ENCOUNTER — Other Ambulatory Visit (HOSPITAL_COMMUNITY)
Admission: RE | Admit: 2023-03-21 | Discharge: 2023-03-21 | Disposition: A | Discharge status: Home / Self Care | Payer: No Typology Code available for payment source | Source: Ambulatory Visit | Attending: Infectious Disease | Admitting: Infectious Disease

## 2023-03-21 ENCOUNTER — Other Ambulatory Visit (HOSPITAL_COMMUNITY): Payer: Self-pay

## 2023-03-21 DIAGNOSIS — Z79899 Other long term (current) drug therapy: Secondary | ICD-10-CM | POA: Diagnosis not present

## 2023-03-21 DIAGNOSIS — Z113 Encounter for screening for infections with a predominantly sexual mode of transmission: Secondary | ICD-10-CM | POA: Diagnosis not present

## 2023-03-21 NOTE — Progress Notes (Signed)
 HPI: Frank Hancock is a 38 y.o. male who presents to the [facility] pharmacy clinic for HIV PrEP follow-up.  Insured   [x]    Uninsured  []    Patient Active Problem List   Diagnosis Date Noted   Annual physical exam 12/21/2015   PCP NOTES >>>>>>>>>>>>>>>>>>>>> 10/01/2015   Anxiety state 10/01/2015   Dyspnea 06/24/2014   Chest pain 06/24/2014   Palpitations 06/24/2014    Patient's Medications  New Prescriptions   No medications on file  Previous Medications   ASPIRIN-ACETAMINOPHEN-CAFFEINE (EXCEDRIN PO)    Take 1 tablet by mouth as needed (Headaches).   EMTRICITABINE -TENOFOVIR  (TRUVADA ) 200-300 MG TABLET    Take 1 tablet by mouth daily.  Modified Medications   No medications on file  Discontinued Medications   No medications on file       08/05/2018    3:18 PM 02/04/2018   10:55 AM 11/07/2017   11:39 AM 10/04/2017   11:41 AM  CHL HIV PREP FLOWSHEET RESULTS  Insurance Status Insured Insured Insured Insured  Gender at birth Male Male Male Male  Gender identity cis-Male cis-Male cis-Male cis-Male  Risk for HIV Hx of STI  Condomless vaginal or anal intercourse   Sex Partners Women only Women only Women only Women only  # sex partners past 3-6 mos  -- 1-3 1-3  Sex activity preferences  Insertive Insertive   Condom use   Yes Yes  % condom use   90 90  Treated for STI? No No  No  HIV symptoms? N/A N/A N/A N/A  PrEP Eligibility Substantial risk for HIV       Labs:  SCr: Lab Results  Component Value Date   CREATININE 0.84 03/13/2022   CREATININE 0.93 03/15/2021   CREATININE 0.81 05/14/2018   CREATININE 0.88 11/07/2017   CREATININE 0.81 10/04/2017   HIV Lab Results  Component Value Date   HIV NON-REACTIVE 12/13/2022   HIV NON-REACTIVE 09/11/2022   HIV NON-REACTIVE 06/12/2022   HIV NON-REACTIVE 03/13/2022   HIV NON-REACTIVE 12/12/2021   Hepatitis B Lab Results  Component Value Date   HEPBSAB REACTIVE (A) 08/05/2018   HEPBSAG NON-REACTIVE 10/04/2017   Hepatitis C Lab  Results  Component Value Date   HEPCAB NON-REACTIVE 10/04/2017   Hepatitis A Lab Results  Component Value Date   HAV REACTIVE (A) 10/04/2017   RPR and STI Lab Results  Component Value Date   LABRPR NON-REACTIVE 12/13/2022   LABRPR NON-REACTIVE 09/11/2022   LABRPR NON-REACTIVE 06/12/2022   LABRPR NON-REACTIVE 03/13/2022   LABRPR NON-REACTIVE 09/12/2021    STI Results GC CT  12/13/2022  9:54 AM Negative  Negative   06/12/2022 10:25 AM Negative  Negative   03/13/2022 10:08 AM Negative  Negative   09/12/2021  2:36 PM Negative  Negative   06/13/2021  2:50 PM Negative  Negative   03/15/2021  2:14 PM Negative  Negative   11/07/2017 12:00 AM Negative  Negative   10/04/2017 12:00 AM Negative  Negative     Assessment: Frank Hancock is here today to follow up for HIV PrEP. Still taking Truvada  without any issues. Has met someone and is thinking about starting a relationship with her. She confided to him that she is HSV-1 positive so he is considering whether he wants to be active with her or not. We spent time discussing HSV and how it is transmitted. Screened for acute HIV symptoms such as fatigue, muscle aches, rash, sore throat, lymphadenopathy, headache, night sweats, nausea/vomiting/diarrhea, and fever. Denies any  symptoms. Wishes to get STI testing today. Due for BMP in February, which he will get a his PCP's office. No other concerns. Will see him back in 3 months.   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 His previous plan denied it. Checked with Abe [employer]bed and it is covered at $0. Will send in refills once his labs return negative.  Plan: - HIV antibody, RPR, and urine cytology today - Descovy  x 3 months if HIV negative - Follow up with me again on 06/13/23  Elpidio Thielen L. Margart Shears, PharmD, BCIDP, AAHIVP, CPP Clinical Pharmacist Practitioner Infectious Diseases Clinical Pharmacist [facility] 03/21/2023, 1:16 PM

## 2023-03-22 ENCOUNTER — Other Ambulatory Visit: Payer: Self-pay

## 2023-03-22 LAB — URINE CYTOLOGY ANCILLARY ONLY
Chlamydia: NEGATIVE
Comment: NEGATIVE
Comment: NORMAL
Neisseria Gonorrhea: NEGATIVE

## 2023-03-22 LAB — RPR: RPR Ser Ql: NONREACTIVE

## 2023-03-22 LAB — HIV ANTIBODY (ROUTINE TESTING W REFLEX): HIV 1&2 Ab, 4th Generation: NONREACTIVE

## 2023-03-23 ENCOUNTER — Other Ambulatory Visit: Payer: Self-pay

## 2023-03-23 ENCOUNTER — Other Ambulatory Visit: Payer: Self-pay | Admitting: Pharmacist

## 2023-03-23 ENCOUNTER — Encounter: Payer: Self-pay | Admitting: Pharmacist

## 2023-03-23 DIAGNOSIS — Z79899 Other long term (current) drug therapy: Secondary | ICD-10-CM

## 2023-03-23 MED ORDER — DESCOVY 200-25 MG PO TABS
1.0000 | ORAL_TABLET | Freq: Every day | ORAL | 2 refills | Status: DC
  Filled 2023-03-26: qty 30, 30d supply, fill #0
  Filled 2023-04-19: qty 30, 30d supply, fill #1
  Filled 2023-05-15 (×2): qty 30, 30d supply, fill #2

## 2023-03-26 ENCOUNTER — Other Ambulatory Visit: Payer: Self-pay

## 2023-03-26 ENCOUNTER — Other Ambulatory Visit (HOSPITAL_COMMUNITY): Payer: Self-pay

## 2023-03-26 NOTE — Progress Notes (Addendum)
Specialty Pharmacy Refill Coordination Note  Frank Hancock is a 38 y.o. male contacted today regarding refills of specialty medication(s) Emtricitabine-Tenofovir AF (Descovy)   Patient requested Delivery   Delivery date: 03/28/23   Verified address: [address] [place] [address]   Medication will be filled on 03/27/23.

## 2023-03-27 ENCOUNTER — Other Ambulatory Visit: Payer: Self-pay

## 2023-04-11 ENCOUNTER — Other Ambulatory Visit (HOSPITAL_COMMUNITY): Payer: Self-pay

## 2023-04-16 ENCOUNTER — Other Ambulatory Visit (HOSPITAL_COMMUNITY): Payer: Self-pay

## 2023-04-19 ENCOUNTER — Other Ambulatory Visit: Payer: Self-pay

## 2023-04-19 NOTE — Progress Notes (Signed)
Specialty Pharmacy Refill Coordination Note  Frank Hancock is a 38 y.o. male contacted today regarding refills of specialty medication(s) Emtricitabine-Tenofovir AF (Descovy)   Patient requested Delivery   Delivery date: 04/24/23   Verified address: [address] [place] [address]   Medication will be filled on 04/23/23.

## 2023-05-10 ENCOUNTER — Other Ambulatory Visit: Payer: Self-pay

## 2023-05-15 ENCOUNTER — Other Ambulatory Visit (HOSPITAL_COMMUNITY): Payer: Self-pay

## 2023-05-15 ENCOUNTER — Other Ambulatory Visit: Payer: Self-pay

## 2023-05-15 NOTE — Progress Notes (Signed)
 Specialty Pharmacy Refill Coordination Note  Frank Hancock is a 38 y.o. male contacted today regarding refills of specialty medication(s) No data recorded  Patient requested (Patient-Rptd) Delivery   Delivery date: (Patient-Rptd) 05/21/23   Verified address: (Patient-Rptd) [address] [place] [address]   Medication will be filled on 05/18/23.

## 2023-06-12 NOTE — Progress Notes (Unsigned)
 HPI: Frank Hancock is a 38 y.o. male who presents to the [facility] pharmacy clinic for HIV PrEP follow-up.  Insured   [x]    Uninsured  []    Patient Active Problem List   Diagnosis Date Noted   Annual physical exam 12/21/2015   PCP NOTES >>>>>>>>>>>>>>>>>>>>> 10/01/2015   Anxiety state 10/01/2015   Dyspnea 06/24/2014   Chest pain 06/24/2014   Palpitations 06/24/2014    Patient's Medications  New Prescriptions   No medications on file  Previous Medications   ASPIRIN-ACETAMINOPHEN-CAFFEINE (EXCEDRIN PO)    Take 1 tablet by mouth as needed (Headaches).   EMTRICITABINE-TENOFOVIR (TRUVADA) 200-300 MG TABLET    Take 1 tablet by mouth daily.   EMTRICITABINE-TENOFOVIR AF (DESCOVY) 200-25 MG TABLET    Take 1 tablet by mouth daily.  Modified Medications   No medications on file  Discontinued Medications   No medications on file       08/05/2018    3:18 PM 02/04/2018   10:55 AM 11/07/2017   11:39 AM 10/04/2017   11:41 AM  CHL HIV PREP FLOWSHEET RESULTS  Insurance Status Insured Insured Insured Insured  Gender at birth Male Male Male Male  Gender identity cis-Male cis-Male cis-Male cis-Male  Risk for HIV Hx of STI  Condomless vaginal or anal intercourse   Sex Partners Women only Women only Women only Women only  # sex partners past 3-6 mos  -- 1-3 1-3  Sex activity preferences  Insertive Insertive   Condom use   Yes Yes  % condom use   90 90  Treated for STI? No No  No  HIV symptoms? N/A N/A N/A N/A  PrEP Eligibility Substantial risk for HIV       Labs:  SCr: Lab Results  Component Value Date   CREATININE 0.84 03/13/2022   CREATININE 0.93 03/15/2021   CREATININE 0.81 05/14/2018   CREATININE 0.88 11/07/2017   CREATININE 0.81 10/04/2017   HIV Lab Results  Component Value Date   HIV NON-REACTIVE 03/21/2023   HIV NON-REACTIVE 12/13/2022   HIV NON-REACTIVE 09/11/2022   HIV NON-REACTIVE 06/12/2022   HIV NON-REACTIVE 03/13/2022   Hepatitis B Lab Results  Component Value Date    HEPBSAB REACTIVE (A) 08/05/2018   HEPBSAG NON-REACTIVE 10/04/2017   Hepatitis C Lab Results  Component Value Date   HEPCAB NON-REACTIVE 10/04/2017   Hepatitis A Lab Results  Component Value Date   HAV REACTIVE (A) 10/04/2017   RPR and STI Lab Results  Component Value Date   LABRPR NON-REACTIVE 03/21/2023   LABRPR NON-REACTIVE 12/13/2022   LABRPR NON-REACTIVE 09/11/2022   LABRPR NON-REACTIVE 06/12/2022   LABRPR NON-REACTIVE 03/13/2022    STI Results GC CT  03/21/2023  1:21 PM Negative  Negative   12/13/2022  9:54 AM Negative  Negative   06/12/2022 10:25 AM Negative  Negative   03/13/2022 10:08 AM Negative  Negative   09/12/2021  2:36 PM Negative  Negative   06/13/2021  2:50 PM Negative  Negative   03/15/2021  2:14 PM Negative  Negative   11/07/2017 12:00 AM Negative  Negative   10/04/2017 12:00 AM Negative  Negative     Assessment: Yakir is here today to follow up for HIV PrEP. Switched at last visit on 03/21/2023 from Truvada to Descovy given new insurance covered for $0. Has been taking Descovy since without any issues.   Screened for acute HIV symptoms such as fatigue, muscle aches, rash, sore throat, lymphadenopathy, headache, night sweats, nausea/vomiting/diarrhea, and fever. Denies  any symptoms. Last STI testing negative for gonorrhea/chlamydia on 03/21/2023. Agrees STI testing today. Due for BMP which he will get a his PCP's office in a few weeks. No other concerns. Will see him back in 3 months.   Plan: - HIV Antibody - Descovy x 3 months if HIV negative - Follow up in 3 months on 09/11/2023 - RPR, urine/oral cytologies - Annual labs with PCP in a few weeks.  Haze Boyden PharmD Candidate

## 2023-06-13 ENCOUNTER — Other Ambulatory Visit: Payer: Self-pay

## 2023-06-13 ENCOUNTER — Ambulatory Visit (INDEPENDENT_AMBULATORY_CARE_PROVIDER_SITE_OTHER): Payer: No Typology Code available for payment source | Admitting: Pharmacist

## 2023-06-13 ENCOUNTER — Other Ambulatory Visit (HOSPITAL_COMMUNITY)
Admission: RE | Admit: 2023-06-13 | Discharge: 2023-06-13 | Disposition: A | Discharge status: Home / Self Care | Source: Ambulatory Visit | Attending: Infectious Disease | Admitting: Infectious Disease

## 2023-06-13 DIAGNOSIS — Z113 Encounter for screening for infections with a predominantly sexual mode of transmission: Secondary | ICD-10-CM | POA: Insufficient documentation

## 2023-06-13 DIAGNOSIS — Z79899 Other long term (current) drug therapy: Secondary | ICD-10-CM | POA: Diagnosis not present

## 2023-06-14 ENCOUNTER — Other Ambulatory Visit: Payer: Self-pay

## 2023-06-14 LAB — URINE CYTOLOGY ANCILLARY ONLY
Chlamydia: NEGATIVE
Comment: NEGATIVE
Comment: NORMAL
Neisseria Gonorrhea: NEGATIVE

## 2023-06-14 LAB — CYTOLOGY, (ORAL, ANAL, URETHRAL) ANCILLARY ONLY
Chlamydia: NEGATIVE
Comment: NEGATIVE
Comment: NORMAL
Neisseria Gonorrhea: NEGATIVE

## 2023-06-14 LAB — RPR: RPR Ser Ql: NONREACTIVE

## 2023-06-14 LAB — HIV ANTIBODY (ROUTINE TESTING W REFLEX): HIV 1&2 Ab, 4th Generation: NONREACTIVE

## 2023-06-15 ENCOUNTER — Other Ambulatory Visit: Payer: Self-pay

## 2023-06-15 DIAGNOSIS — Z79899 Other long term (current) drug therapy: Secondary | ICD-10-CM

## 2023-06-15 MED ORDER — DESCOVY 200-25 MG PO TABS: 1.0000 | ORAL_TABLET | Freq: Every day | ORAL | 2 refills | Status: DC

## 2023-06-18 ENCOUNTER — Other Ambulatory Visit: Payer: Self-pay

## 2023-06-18 MED ORDER — DESCOVY 200-25 MG PO TABS
1.0000 | ORAL_TABLET | Freq: Every day | ORAL | 2 refills | Status: DC
Start: 2023-06-18 — End: 2023-06-19
  Filled 2023-06-18: qty 30, 30d supply, fill #0

## 2023-06-18 NOTE — Progress Notes (Signed)
 Specialty Pharmacy Refill Coordination Note  Frank Hancock is a 38 y.o. male contacted today regarding refills of specialty medication(s) Emtricitabine-Tenofovir AF (Descovy)   Patient requested Delivery   Delivery date: 06/19/23   Verified address: [address] [place] [address]   Medication will be filled on 06/18/23.

## 2023-06-19 ENCOUNTER — Ambulatory Visit (INDEPENDENT_AMBULATORY_CARE_PROVIDER_SITE_OTHER): Payer: No Typology Code available for payment source | Admitting: Medical

## 2023-06-19 ENCOUNTER — Encounter: Payer: Self-pay | Admitting: Medical

## 2023-06-19 VITALS — BP 145/90 | HR 92 | Temp 98.2°F | Resp 18 | Ht 65.0 in | Wt 173.8 lb

## 2023-06-19 DIAGNOSIS — I10 Essential (primary) hypertension: Secondary | ICD-10-CM | POA: Diagnosis not present

## 2023-06-19 DIAGNOSIS — Z13220 Encounter for screening for lipoid disorders: Secondary | ICD-10-CM

## 2023-06-19 DIAGNOSIS — Z0000 Encounter for general adult medical examination without abnormal findings: Secondary | ICD-10-CM

## 2023-06-19 DIAGNOSIS — M25562 Pain in left knee: Secondary | ICD-10-CM | POA: Diagnosis not present

## 2023-06-19 DIAGNOSIS — M25561 Pain in right knee: Secondary | ICD-10-CM | POA: Diagnosis not present

## 2023-06-19 NOTE — Progress Notes (Signed)
 Subjective:    Patient ID: Frank Hancock, male    DOB: 06-20-1985, 38 y.o.   MRN: [id number]  HPI   Pt in for first time with me. Pt is fasting.  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 He exercises every other day. Walks dog, goes to gym and plays soccer.  Pt on descoby for daily for prep. Pt see ID with [facility].  Pt has bilateral knee pain recently. Rt side worse than left. Worse when playing socker no prior knee injury. When rest knee pain goes away.  Pt told had high blood pressure. Usually bp about 140/90. Today about same level. His bp is higher at home. BP has been high despite low salt diet and exercise. And purposeful 13 lb wt loss in 3 months.     Review of Systems  Constitutional:  Negative for chills, fatigue and fever.  Respiratory:  Negative for cough, chest tightness, shortness of breath and wheezing.   Cardiovascular:  Negative for chest pain and palpitations.  Gastrointestinal:  Negative for abdominal pain and blood in stool.  Genitourinary:  Negative for dysuria and frequency.  Musculoskeletal:  Negative for back pain and joint swelling.  Neurological:  Negative for facial asymmetry, weakness and numbness.  Hematological:  Negative for adenopathy. Does not bruise/bleed easily.  Psychiatric/Behavioral:  Negative for behavioral problems and confusion.     Past Medical History:  Diagnosis Date   Chest pain    Frequent headaches    H/O syncope    pre- syncope, no LOC   History of palpitations    Hyperlipidemia    Hypertension          Social History   Socioeconomic History   Marital status: Single    Spouse name: Not on file   Number of children: 0   Years of education: Not on file   Highest education level: Not on file  Occupational History   Occupation: Insurance underwriter - [employer]    Comment: Computer  Tobacco Use   Smoking status: Never   Smokeless tobacco: Never  Substance and Sexual Activity   Alcohol use: Yes    Alcohol/week: 0.0 standard  drinks of alcohol    Comment: Occasional   Drug use: No   Sexual activity: Not on file  Other Topics Concern   Not on file  Social History Narrative   Born in [place]   Moved from [place]  2016, lives w/ mother    Social Drivers of Corporate investment banker Strain: Low Risk  (02/22/2023)   Received from [facility]   Overall Financial Resource Strain (CARDIA)    Difficulty of Paying Living Expenses: Not hard at all  Food Insecurity: Low Risk  (06/15/2023)   Received from [facility]   Hunger Vital Sign    Worried About Running Out of Food in the Last Year: Never true    Ran Out of Food in the Last Year: Never true  Transportation Needs: No Transportation Needs (06/15/2023)   Received from [facility]    In the past 12 months, has lack of reliable transportation kept you from medical appointments, meetings, work or from getting things needed for daily living? : No  Physical Activity: Not on file  Stress: No Stress Concern Present (12/19/2019)   Received from [facility], [facility]   [employer] of Occupational Health - Occupational Stress Questionnaire    Feeling of Stress : Not at all  Social Connections: Unknown (07/03/2021)  Received from [facility], [facility]   Social Network    Social Network: Not on file  Intimate Partner Violence: Unknown (06/09/2021)   Received from [facility], [facility]   HITS    Physically Hurt: Not on file    Insult or Talk Down To: Not on file    Threaten Physical Harm: Not on file    Scream or Curse: Not on file    Past Surgical History:  Procedure Laterality Date   CHEST SURGERY  2001   cosmetic, breast reduction    Family History  Problem Relation Age of Onset   Hypertension Mother    Hypertension Sister    Diabetes Brother    Hypertension Brother    Heart defect Maternal Grandmother        valve repair   Colon cancer Neg Hx    Prostate cancer Neg Hx    CAD Neg Hx     No  Known Allergies  No current outpatient medications on file prior to visit.   No current facility-administered medications on file prior to visit.    BP (!) 145/90   Pulse 92   Temp 98.2 F (36.8 C)   Resp 18   Ht 5\' 5"  (1.651 m)   Wt 173 lb 12.8 oz (78.8 kg)   SpO2 95%   BMI 28.92 kg/m        Objective:   Physical Exam  General Mental Status- Alert. General Appearance- Not in acute distress.   Skin General: Color- Normal Color. Moisture- Normal Moisture.  Neck Carotid Arteries- Normal color. Moisture- Normal Moisture. No carotid bruits. No JVD.  Chest and Lung Exam Auscultation: Breath Sounds:-CTA  Cardiovascular Auscultation:Rythm- RRR Murmurs & Other Heart Sounds:Auscultation of the heart reveals- No Murmurs.  Abdomen Inspection:-Inspeection Normal. Palpation/Percussion:Note:No mass. Palpation and Percussion of the abdomen reveal- Non Tender, Non Distended + BS, no rebound or guarding.   Neurologic Cranial Nerve exam:- CN III-XII intact(No nystagmus), symmetric smile. Strength:- 5/5 equal and symmetric strength both upper and lower extremities.   Knees- good rom. No crepitus. Rt knee mild tender over medial knee/ over tibial platea area.    Assessment & Plan:   Patient Instructions  For you wellness exam today I have ordered cbc, cmp and lipid panel.  Vaccines up to date.  Recommend exercise and healthy diet.  We will let you know lab results as they come in.  Follow up date appointment will be determined after lab review.  Htn- follow labs and decide on bp med after lab review.  Knee pain bilateral- refer to sports med MD.     Sylvia Everts, PA-C

## 2023-06-19 NOTE — Patient Instructions (Addendum)
 For you wellness exam today I have ordered cbc, cmp and lipid panel.  Vaccines up to date.  Recommend exercise and healthy diet.  We will let you know lab results as they come in.  Follow up date appointment will be determined after lab review.  Htn- follow labs and decide on bp med after lab review.  Knee pain bilateral- refer to sports med MD.  Preventive Care 32-39 Years Old, Male Preventive care refers to lifestyle choices and visits with your health care provider that can promote health and wellness. Preventive care visits are also called wellness exams. What can I expect for my preventive care visit? Counseling During your preventive care visit, your health care provider may ask about your: Medical history, including: Past medical problems. Family medical history. Current health, including: Emotional well-being. Home life and relationship well-being. Sexual activity. Lifestyle, including: Alcohol, nicotine or tobacco, and drug use. Access to firearms. Diet, exercise, and sleep habits. Safety issues such as seatbelt and bike helmet use. Sunscreen use. Work and work Astronomer. Physical exam Your health care provider may check your: Height and weight. These may be used to calculate your BMI (body mass index). BMI is a measurement that tells if you are at a healthy weight. Waist circumference. This measures the distance around your waistline. This measurement also tells if you are at a healthy weight and may help predict your risk of certain diseases, such as type 2 diabetes and high blood pressure. Heart rate and blood pressure. Body temperature. Skin for abnormal spots. What immunizations do I need?  Vaccines are usually given at various ages, according to a schedule. Your health care provider will recommend vaccines for you based on your age, medical history, and lifestyle or other factors, such as travel or where you work. What tests do I need? Screening Your  health care provider may recommend screening tests for certain conditions. This may include: Lipid and cholesterol levels. Diabetes screening. This is done by checking your blood sugar (glucose) after you have not eaten for a while (fasting). Hepatitis B test. Hepatitis C test. HIV (human immunodeficiency virus) test. STI (sexually transmitted infection) testing, if you are at risk. Talk with your health care provider about your test results, treatment options, and if necessary, the need for more tests. Follow these instructions at home: Eating and drinking  Eat a healthy diet that includes fresh fruits and vegetables, whole grains, lean protein, and low-fat dairy products. Drink enough fluid to keep your urine pale yellow. Take vitamin and mineral supplements as recommended by your health care provider. Do not drink alcohol if your health care provider tells you not to drink. If you drink alcohol: Limit how much you have to 0-2 drinks a day. Know how much alcohol is in your drink. In the [place] one drink equals one 12 oz bottle of beer (355 mL), one 5 oz glass of wine (148 mL), or one 1 oz glass of hard liquor (44 mL). Lifestyle Brush your teeth every morning and night with fluoride toothpaste. Floss one time each day. Exercise for at least 30 minutes 5 or more days each week. Do not use any products that contain nicotine or tobacco. These products include cigarettes, chewing tobacco, and vaping devices, such as e-cigarettes. If you need help quitting, ask your health care provider. Do not use drugs. If you are sexually active, practice safe sex. Use a condom or other form of protection to prevent STIs. Find healthy ways to manage stress, such as:  Meditation, yoga, or listening to music. Journaling. Talking to a trusted person. Spending time with friends and family. Minimize exposure to UV radiation to reduce your risk of skin cancer. Safety Always wear your seat belt while driving or  riding in a vehicle. Do not drive: If you have been drinking alcohol. Do not ride with someone who has been drinking. If you have been using any mind-altering substances or drugs. While texting. When you are tired or distracted. Wear a helmet and other protective equipment during sports activities. If you have firearms in your house, make sure you follow all gun safety procedures. Seek help if you have been physically or sexually abused. What's next? Go to your health care provider once a year for an annual wellness visit. Ask your health care provider how often you should have your eyes and teeth checked. Stay up to date on all vaccines. This information is not intended to replace advice given to you by your health care provider. Make sure you discuss any questions you have with your health care provider. Document Revised: 08/18/2020 Document Reviewed: 08/18/2020 Elsevier Patient Education  2024 [employer].

## 2023-06-20 LAB — LIPID PANEL
Cholesterol: 193 mg/dL (ref 0–200)
HDL: 37.4 mg/dL — ABNORMAL LOW (ref >39.00)
LDL Cholesterol: 130 mg/dL — ABNORMAL HIGH (ref 0–99)
NonHDL: 155.65
Total CHOL/HDL Ratio: 5
Triglycerides: 126 mg/dL (ref 0.0–149.0)
VLDL: 25.2 mg/dL (ref 0.0–40.0)

## 2023-06-20 LAB — CBC WITH DIFFERENTIAL/PLATELET
Basophils Absolute: 0 10*3/uL (ref 0.0–0.1)
Basophils Relative: 0.6 % (ref 0.0–3.0)
Eosinophils Absolute: 0.1 10*3/uL (ref 0.0–0.7)
Eosinophils Relative: 0.9 % (ref 0.0–5.0)
HCT: 46.1 % (ref 39.0–52.0)
Hemoglobin: 15.3 g/dL (ref 13.0–17.0)
Lymphocytes Relative: 34.5 % (ref 12.0–46.0)
Lymphs Abs: 2.4 10*3/uL (ref 0.7–4.0)
MCHC: 33.1 g/dL (ref 30.0–36.0)
MCV: 85.2 fl (ref 78.0–100.0)
Monocytes Absolute: 0.5 10*3/uL (ref 0.1–1.0)
Monocytes Relative: 7.9 % (ref 3.0–12.0)
Neutro Abs: 3.9 10*3/uL (ref 1.4–7.7)
Neutrophils Relative %: 56.1 % (ref 43.0–77.0)
Platelets: 269 10*3/uL (ref 150.0–400.0)
RBC: 5.41 Mil/uL (ref 4.22–5.81)
RDW: 13.3 % (ref 11.5–15.5)
WBC: 6.9 10*3/uL (ref 4.0–10.5)

## 2023-06-20 LAB — COMPREHENSIVE METABOLIC PANEL WITH GFR
ALT: 61 U/L — ABNORMAL HIGH (ref 0–53)
AST: 33 U/L (ref 0–37)
Albumin: 5 g/dL (ref 3.5–5.2)
Alkaline Phosphatase: 86 U/L (ref 39–117)
BUN: 11 mg/dL (ref 6–23)
CO2: 23 meq/L (ref 19–32)
Calcium: 9.8 mg/dL (ref 8.4–10.5)
Chloride: 105 meq/L (ref 96–112)
Creatinine, Ser: 0.86 mg/dL (ref 0.40–1.50)
GFR: 109.36 mL/min (ref >60.00)
Glucose, Bld: 93 mg/dL (ref 70–99)
Potassium: 3.9 meq/L (ref 3.5–5.1)
Sodium: 139 meq/L (ref 135–145)
Total Bilirubin: 1 mg/dL (ref 0.2–1.2)
Total Protein: 7.6 g/dL (ref 6.0–8.3)

## 2023-06-21 ENCOUNTER — Other Ambulatory Visit (HOSPITAL_BASED_OUTPATIENT_CLINIC_OR_DEPARTMENT_OTHER): Payer: Self-pay

## 2023-06-21 ENCOUNTER — Encounter: Payer: Self-pay | Admitting: Medical

## 2023-06-21 MED ORDER — LOSARTAN POTASSIUM 25 MG PO TABS
25.0000 mg | ORAL_TABLET | Freq: Every day | ORAL | 0 refills | Status: DC
  Filled 2023-06-21: qty 30, 30d supply, fill #0

## 2023-06-21 NOTE — Addendum Note (Signed)
 Addended by: Serafina Damme on: 06/21/2023 05:19 AM   Modules accepted: Orders

## 2023-07-02 ENCOUNTER — Ambulatory Visit: Admitting: Sports Medicine

## 2023-07-02 NOTE — Progress Notes (Unsigned)
    Ben Jackson D.[name] [address] [place] [address] Phone: [phone]   Assessment and Plan:     There are no diagnoses linked to this encounter.  ***   Pertinent previous records reviewed include ***    Follow Up: ***     Subjective:   I, Jerimy Johanson, am serving as a Neurosurgeon for Doctor Ulysees Gander  Chief Complaint: bilat knee pain   HPI:   07/03/2023 Patient is a 38 year old male with bilat knee pain. Patient states   Relevant Historical Information: ***  Additional pertinent review of systems negative.   Current Outpatient Medications:    losartan  (COZAAR ) 25 MG tablet, Take 1 tablet (25 mg total) by mouth daily., Disp: 30 tablet, Rfl: 0   Objective:     There were no vitals filed for this visit.    There is no height or weight on file to calculate BMI.    Physical Exam:    ***   Electronically signed by:  Marshall Skeeter D.[name] 10:30 AM 07/02/23

## 2023-07-03 ENCOUNTER — Ambulatory Visit (INDEPENDENT_AMBULATORY_CARE_PROVIDER_SITE_OTHER)

## 2023-07-03 ENCOUNTER — Other Ambulatory Visit (HOSPITAL_BASED_OUTPATIENT_CLINIC_OR_DEPARTMENT_OTHER): Payer: Self-pay

## 2023-07-03 ENCOUNTER — Ambulatory Visit: Admitting: Sports Medicine

## 2023-07-03 VITALS — BP 138/80 | HR 76 | Ht 65.0 in | Wt 173.0 lb

## 2023-07-03 DIAGNOSIS — M25561 Pain in right knee: Secondary | ICD-10-CM

## 2023-07-03 DIAGNOSIS — M2351 Chronic instability of knee, right knee: Secondary | ICD-10-CM

## 2023-07-03 DIAGNOSIS — G8929 Other chronic pain: Secondary | ICD-10-CM | POA: Diagnosis not present

## 2023-07-03 DIAGNOSIS — M25562 Pain in left knee: Secondary | ICD-10-CM

## 2023-07-03 MED ORDER — MELOXICAM 15 MG PO TABS
15.0000 mg | ORAL_TABLET | Freq: Every day | ORAL | 0 refills | Status: DC
  Filled 2023-07-03 – 2023-07-16 (×2): qty 30, 30d supply, fill #0

## 2023-07-03 NOTE — Patient Instructions (Signed)
-   Start meloxicam  15 mg daily x2 weeks.  If still having pain after 2 weeks, complete 3rd-week of NSAID. May use remaining NSAID as needed once daily for pain control.  Do not to use additional over-the-counter NSAIDs (ibuprofen, naproxen, Advil, Aleve, etc.) while taking prescription NSAIDs.  May use Tylenol [phone] mg 2 to 3 times a day for breakthrough pain. Knee HEP Right knee MRI  Follow up 1 week after MRI to discuss results

## 2023-07-04 ENCOUNTER — Other Ambulatory Visit: Payer: Self-pay | Admitting: Pharmacist

## 2023-07-04 ENCOUNTER — Other Ambulatory Visit: Payer: Self-pay

## 2023-07-04 DIAGNOSIS — Z79899 Other long term (current) drug therapy: Secondary | ICD-10-CM

## 2023-07-04 MED ORDER — DESCOVY 200-25 MG PO TABS
1.0000 | ORAL_TABLET | Freq: Every day | ORAL | 2 refills | Status: DC
  Filled 2023-07-04 – 2023-07-09 (×2): qty 30, 30d supply, fill #0
  Filled 2023-07-31: qty 30, 30d supply, fill #1
  Filled 2023-09-06: qty 30, 30d supply, fill #2

## 2023-07-08 ENCOUNTER — Ambulatory Visit

## 2023-07-08 DIAGNOSIS — M25561 Pain in right knee: Secondary | ICD-10-CM | POA: Diagnosis not present

## 2023-07-08 DIAGNOSIS — M2351 Chronic instability of knee, right knee: Secondary | ICD-10-CM

## 2023-07-08 DIAGNOSIS — G8929 Other chronic pain: Secondary | ICD-10-CM

## 2023-07-09 ENCOUNTER — Other Ambulatory Visit: Payer: Self-pay

## 2023-07-09 ENCOUNTER — Other Ambulatory Visit: Payer: Self-pay | Admitting: Pharmacy Technician

## 2023-07-09 ENCOUNTER — Encounter: Payer: Self-pay | Admitting: Sports Medicine

## 2023-07-09 NOTE — Progress Notes (Signed)
 Specialty Pharmacy Refill Coordination Note  Frank Hancock is a 38 y.o. male contacted today regarding refills of specialty medication(s) Emtricitabine -Tenofovir  AF (Descovy )   Patient requested Delivery   Delivery date: 07/13/23   Verified address: [address] [place] [address]   Medication will be filled on 07/12/23.

## 2023-07-12 NOTE — Progress Notes (Signed)
    Frank Hancock D.[name] [address] [place] [address] Phone: [phone]   Assessment and Plan:     1. Chronic pain of both knees 2. Chronic instability of right knee  -Chronic with exacerbation, subsequent visit - Significant improvement in bilateral knee pain after starting HEP and completing course of meloxicam  - Reviewed patient's right knee MRI which showed only mild cartilage thinning of the medial trochlea and edema in the posterior lateral corner.  No significant findings that require further intervention at this time - Start Tylenol 500 to 1000 mg tablets 2-3 times a day for day-to-day pain relief - May use NSAIDs including leftover meloxicam  daily as needed for breakthrough pain.  Recommend limiting chronic NSAIDs to 1-2 doses per week - Continue HEP -May return to all physical activity as tolerated  Pertinent previous records reviewed include none  Follow Up: As needed if no improvement or worsening of symptoms.  Could consider repeat NSAID course versus physical therapy   Subjective:   I, Frank Hancock, am serving as a Neurosurgeon for Doctor Frank Hancock   Chief Complaint: bilat knee pain    HPI:    07/03/2023 Patient is a 38 year old male with bilat knee pain. 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 Thinks he tore something , pain for about a year he did a cutting motion and gelt something snap. Left knee pain started a couple of weeks ago. Tylenol and ibu for the pain and that helps when he takes. Whole knee pain , will feel a snap when if he steps wrong. No numbness or tingling.    07/13/2023 Patient states he is good . Meloxicam  is working great    Relevant Historical Information: None pertinent    Additional pertinent review of systems negative.   Current Outpatient Medications:    emtricitabine -tenofovir  AF (DESCOVY ) 200-25 MG tablet, Take 1 tablet by mouth daily., Disp: 30 tablet, Rfl: 2   losartan  (COZAAR ) 25  MG tablet, Take 1 tablet (25 mg total) by mouth daily., Disp: 30 tablet, Rfl: 0   meloxicam  (MOBIC ) 15 MG tablet, Take 1 tablet (15 mg total) by mouth daily., Disp: 30 tablet, Rfl: 0   Objective:     Vitals:   07/13/23 1405  BP: 132/84  Pulse: 79  SpO2: 96%  Weight: 179 lb (81.2 kg)  Height: 5\' 5"  (1.651 m)      Body mass index is 29.79 kg/m.    Physical Exam:    General:  awake, alert oriented, no acute distress nontoxic Skin: no suspicious lesions or rashes Neuro:sensation intact and strength 5/5 with no deficits, no atrophy, normal muscle tone Psych: No signs of anxiety, depression or other mood disorder  Bilateral knee: No swelling No deformity Neg fluid wave, joint milking ROM Flex 110, Ext 0 NTTP over the quad tendon, medial fem condyle, lat fem condyle, patella, plica, patella tendon, tibial tuberostiy, fibular head, posterior fossa, pes anserine bursa, gerdy's tubercle, medial jt line, lateral jt line Neg anterior and posterior drawer Neg lachman Neg sag sign Negative varus stress Negative valgus stress Negative McMurray Negative Thessaly  Gait normal    Electronically signed by:  Marshall Skeeter D.[name] 2:19 PM 07/13/23

## 2023-07-13 ENCOUNTER — Ambulatory Visit (INDEPENDENT_AMBULATORY_CARE_PROVIDER_SITE_OTHER): Admitting: Sports Medicine

## 2023-07-13 VITALS — BP 132/84 | HR 79 | Ht 65.0 in | Wt 179.0 lb

## 2023-07-13 DIAGNOSIS — M2351 Chronic instability of knee, right knee: Secondary | ICD-10-CM

## 2023-07-13 DIAGNOSIS — M25561 Pain in right knee: Secondary | ICD-10-CM | POA: Diagnosis not present

## 2023-07-13 DIAGNOSIS — G8929 Other chronic pain: Secondary | ICD-10-CM

## 2023-07-13 DIAGNOSIS — M25562 Pain in left knee: Secondary | ICD-10-CM | POA: Diagnosis not present

## 2023-07-13 NOTE — Patient Instructions (Addendum)
-   Start Tylenol 500 to 1000 mg tablets 2-3 times a day for day-to-day pain relief May use meloxicam  or other NSAID for breakthrough pain. Recommend using only 1 per week. Continue Home Exercise Program. Follow up as needed.

## 2023-07-15 ENCOUNTER — Other Ambulatory Visit: Payer: Self-pay | Admitting: Medical

## 2023-07-16 ENCOUNTER — Other Ambulatory Visit (HOSPITAL_COMMUNITY): Payer: Self-pay

## 2023-07-16 ENCOUNTER — Other Ambulatory Visit: Payer: Self-pay

## 2023-07-16 ENCOUNTER — Other Ambulatory Visit (HOSPITAL_BASED_OUTPATIENT_CLINIC_OR_DEPARTMENT_OTHER): Payer: Self-pay

## 2023-07-16 MED ORDER — LOSARTAN POTASSIUM 25 MG PO TABS
25.0000 mg | ORAL_TABLET | Freq: Every day | ORAL | 0 refills | Status: DC
  Filled 2023-07-16 (×2): qty 30, 30d supply, fill #0

## 2023-07-17 ENCOUNTER — Other Ambulatory Visit: Payer: Self-pay

## 2023-07-17 ENCOUNTER — Other Ambulatory Visit (HOSPITAL_BASED_OUTPATIENT_CLINIC_OR_DEPARTMENT_OTHER): Payer: Self-pay

## 2023-07-31 ENCOUNTER — Other Ambulatory Visit: Payer: Self-pay

## 2023-07-31 NOTE — Progress Notes (Signed)
 Specialty Pharmacy Refill Coordination Note  Frank Hancock is a 38 y.o. male contacted today regarding refills of specialty medication(s) Emtricitabine -Tenofovir  AF (Descovy )   Patient requested Delivery   Delivery date: 08/07/23   Verified address: [address] [place] [address]   Medication will be filled on 08/06/23.

## 2023-07-31 NOTE — Progress Notes (Signed)
 Specialty Pharmacy Ongoing Clinical Assessment Note  Frank Hancock is a 38 y.o. male who is being followed by the specialty pharmacy service for RxSp HIV PrEP   Patient's specialty medication(s) reviewed today: Emtricitabine -Tenofovir  AF (Descovy )   Missed doses in the last 4 weeks: 0   Patient/Caregiver did not have any additional questions or concerns.   Therapeutic benefit summary: Patient is achieving benefit   Adverse events/side effects summary: No adverse events/side effects   Patient's therapy is appropriate to: Continue    Goals Addressed             This Visit's Progress    Achieve Undetectable HIV Viral Load < 20   On track    Patient is on track. Patient will maintain adherence. Patient's viral load remains undetectable long term         Follow up: 6 months  [facility]

## 2023-08-02 ENCOUNTER — Ambulatory Visit: Admitting: Medical

## 2023-08-27 ENCOUNTER — Other Ambulatory Visit: Payer: Self-pay

## 2023-09-02 ENCOUNTER — Other Ambulatory Visit: Payer: Self-pay | Admitting: Medical

## 2023-09-03 ENCOUNTER — Other Ambulatory Visit (HOSPITAL_BASED_OUTPATIENT_CLINIC_OR_DEPARTMENT_OTHER): Payer: Self-pay

## 2023-09-03 ENCOUNTER — Other Ambulatory Visit: Payer: Self-pay

## 2023-09-03 MED ORDER — LOSARTAN POTASSIUM 25 MG PO TABS
25.0000 mg | ORAL_TABLET | Freq: Every day | ORAL | 0 refills | Status: DC
Start: 2023-09-03 — End: 2023-10-07
  Filled 2023-09-03: qty 30, 30d supply, fill #0

## 2023-09-06 ENCOUNTER — Other Ambulatory Visit: Payer: Self-pay

## 2023-09-06 NOTE — Progress Notes (Signed)
 Specialty Pharmacy Refill Coordination Note  Frank Hancock is a 38 y.o. male contacted today regarding refills of specialty medication(s) Emtricitabine -Tenofovir  AF (Descovy )   Patient requested Delivery   Delivery date: 09/11/23   Verified address: [address] [place] [address]   Medication will be filled on 07.07.25.

## 2023-09-10 NOTE — Progress Notes (Unsigned)
 HPI: Frank Hancock is a 38 y.o. male who presents to the [facility] pharmacy clinic for HIV PrEP follow-up.  Insured   [x]    Uninsured  []    Patient Active Problem List   Diagnosis Date Noted   Annual physical exam 12/21/2015   PCP NOTES >>>>>>>>>>>>>>>>>>>>> 10/01/2015   Anxiety state 10/01/2015   Dyspnea 06/24/2014   Chest pain 06/24/2014   Palpitations 06/24/2014    Patient's Medications  New Prescriptions   No medications on file  Previous Medications   EMTRICITABINE -TENOFOVIR  AF (DESCOVY ) 200-25 MG TABLET    Take 1 tablet by mouth daily.   LOSARTAN  (COZAAR ) 25 MG TABLET    Take 1 tablet (25 mg total) by mouth daily.   MELOXICAM  (MOBIC ) 15 MG TABLET    Take 1 tablet (15 mg total) by mouth daily.  Modified Medications   No medications on file  Discontinued Medications   No medications on file       08/05/2018    3:18 PM 02/04/2018   10:55 AM 11/07/2017   11:39 AM 10/04/2017   11:41 AM  [facility] HIV PREP FLOWSHEET RESULTS  Insurance Status Insured Insured Insured Insured  Gender at birth Male Male Male Male  Gender identity cis-Male  cis-Male  cis-Male  cis-Male   Risk for HIV Hx of STI   Condomless vaginal or anal intercourse    Sex Partners Women only Women only Women only Women only  # sex partners past 3-6 mos  --  1-3  1-3   Sex activity preferences  Insertive Insertive   Condom use   Yes Yes  % condom use   90 90  Treated for STI? No No  No  HIV symptoms? N/A  N/A  N/A  N/A   PrEP Eligibility Substantial risk for HIV         Data saved with a previous flowsheet row definition    Labs:  SCr: Lab Results  Component Value Date   CREATININE 0.86 06/19/2023   CREATININE 0.84 03/13/2022   CREATININE 0.93 03/15/2021   CREATININE 0.81 05/14/2018   CREATININE 0.88 11/07/2017   HIV Lab Results  Component Value Date   HIV NON-REACTIVE 06/13/2023   HIV NON-REACTIVE 03/21/2023   HIV NON-REACTIVE 12/13/2022   HIV NON-REACTIVE 09/11/2022   HIV NON-REACTIVE 06/12/2022    Hepatitis B Lab Results  Component Value Date   HEPBSAB REACTIVE (A) 08/05/2018   HEPBSAG NON-REACTIVE 10/04/2017   Hepatitis C Lab Results  Component Value Date   HEPCAB NON-REACTIVE 10/04/2017   Hepatitis A Lab Results  Component Value Date   HAV REACTIVE (A) 10/04/2017   RPR and STI Lab Results  Component Value Date   LABRPR NON-REACTIVE 06/13/2023   LABRPR NON-REACTIVE 03/21/2023   LABRPR NON-REACTIVE 12/13/2022   LABRPR NON-REACTIVE 09/11/2022   LABRPR NON-REACTIVE 06/12/2022    STI Results GC CT  06/13/2023  1:28 PM Negative    Negative  Negative    Negative   03/21/2023  1:21 PM Negative  Negative   12/13/2022  9:54 AM Negative  Negative   06/12/2022 10:25 AM Negative  Negative   03/13/2022 10:08 AM Negative  Negative   09/12/2021  2:36 PM Negative  Negative   06/13/2021  2:50 PM Negative  Negative   03/15/2021  2:14 PM Negative  Negative   11/07/2017 12:00 AM Negative  Negative   10/04/2017 12:00 AM Negative  Negative     Assessment: Frank Hancock is here today to follow up for  HIV PrEP. He continues to do well on Descovy  without any issues or problems. Last HIV antibody and STI testing were negative on 06/13/23. Requesting STI screening today. Also requesting a HIV RNA instead of an antibody as he states that he had unprotected sex with his partner. I reassured him that as long as he was taking his PrEP that he was covered, but he still requested a RNA test so will order that today.  Screened for acute HIV symptoms such as fatigue, muscle aches, rash, sore throat, lymphadenopathy, headache, night sweats, nausea/vomiting/diarrhea, and fever. Denies any symptoms.  Up to date on all recommended vaccinations. He had his SCr checked at his PCP's office back in April and it was WNL. Lipid panel also checked in April and is being monitored by his PCP. No issues today. Will check labs and see him back 3 in months.   Plan: - HIV RNA, RPR, and urine cytology today - Descovy  x 3  months if HIV negative - Follow up with me again on 12/17/23  Jeb Schloemer L. Sarahanne Novakowski, PharmD, BCIDP, AAHIVP, CPP Clinical Pharmacist Practitioner - Infectious Diseases Clinical Pharmacist Lead - Specialty Pharmacy [facility] 09/10/2023, 1:29 PM

## 2023-09-11 ENCOUNTER — Ambulatory Visit (INDEPENDENT_AMBULATORY_CARE_PROVIDER_SITE_OTHER): Admitting: Pharmacist

## 2023-09-11 ENCOUNTER — Other Ambulatory Visit: Payer: Self-pay

## 2023-09-11 ENCOUNTER — Other Ambulatory Visit (HOSPITAL_COMMUNITY)
Admission: RE | Admit: 2023-09-11 | Discharge: 2023-09-11 | Disposition: A | Discharge status: Home / Self Care | Source: Ambulatory Visit | Attending: Infectious Disease | Admitting: Infectious Disease

## 2023-09-11 DIAGNOSIS — Z79899 Other long term (current) drug therapy: Secondary | ICD-10-CM | POA: Diagnosis not present

## 2023-09-11 DIAGNOSIS — Z113 Encounter for screening for infections with a predominantly sexual mode of transmission: Secondary | ICD-10-CM

## 2023-09-12 LAB — URINE CYTOLOGY ANCILLARY ONLY
Chlamydia: NEGATIVE
Comment: NEGATIVE
Comment: NORMAL
Neisseria Gonorrhea: NEGATIVE

## 2023-09-13 LAB — HIV-1 RNA QUANT-NO REFLEX-BLD
HIV 1 RNA Quant: NOT DETECTED {copies}/mL
HIV-1 RNA Quant, Log: NOT DETECTED {Log_copies}/mL

## 2023-09-13 LAB — RPR: RPR Ser Ql: NONREACTIVE

## 2023-10-05 ENCOUNTER — Other Ambulatory Visit: Payer: Self-pay | Admitting: Pharmacist

## 2023-10-05 ENCOUNTER — Other Ambulatory Visit (HOSPITAL_COMMUNITY): Payer: Self-pay

## 2023-10-05 ENCOUNTER — Other Ambulatory Visit: Payer: Self-pay

## 2023-10-05 DIAGNOSIS — Z79899 Other long term (current) drug therapy: Secondary | ICD-10-CM

## 2023-10-05 MED ORDER — DESCOVY 200-25 MG PO TABS
1.0000 | ORAL_TABLET | Freq: Every day | ORAL | 2 refills | Status: DC
  Filled 2023-10-05 – 2023-10-08 (×2): qty 30, 30d supply, fill #0
  Filled 2023-11-06: qty 30, 30d supply, fill #1
  Filled 2023-11-30: qty 30, 30d supply, fill #2

## 2023-10-07 ENCOUNTER — Encounter (INDEPENDENT_AMBULATORY_CARE_PROVIDER_SITE_OTHER): Payer: Self-pay

## 2023-10-07 ENCOUNTER — Other Ambulatory Visit: Payer: Self-pay | Admitting: Medical

## 2023-10-08 ENCOUNTER — Other Ambulatory Visit (HOSPITAL_COMMUNITY): Payer: Self-pay

## 2023-10-08 ENCOUNTER — Other Ambulatory Visit: Payer: Self-pay | Admitting: Pharmacy Technician

## 2023-10-08 ENCOUNTER — Other Ambulatory Visit: Payer: Self-pay

## 2023-10-08 MED ORDER — LOSARTAN POTASSIUM 25 MG PO TABS
25.0000 mg | ORAL_TABLET | Freq: Every day | ORAL | 0 refills | Status: DC
  Filled 2023-10-08 – 2023-10-22 (×3): qty 30, 30d supply, fill #0

## 2023-10-08 NOTE — Progress Notes (Signed)
 Specialty Pharmacy Refill Coordination Note  Frank Hancock is a 38 y.o. male contacted today regarding refills of specialty medication(s) Emtricitabine -Tenofovir  AF (Descovy )   Patient requested (Patient-Rptd) Delivery   Delivery date: 10/10/23 Verified address: (Patient-Rptd) [address] [place] [address]   Medication will be filled on 10/09/23.

## 2023-10-09 ENCOUNTER — Other Ambulatory Visit: Payer: Self-pay

## 2023-10-09 ENCOUNTER — Encounter: Payer: Self-pay | Admitting: Pharmacist

## 2023-10-12 ENCOUNTER — Other Ambulatory Visit: Payer: Self-pay

## 2023-10-22 ENCOUNTER — Other Ambulatory Visit: Payer: Self-pay

## 2023-10-22 ENCOUNTER — Other Ambulatory Visit (HOSPITAL_COMMUNITY): Payer: Self-pay

## 2023-10-22 ENCOUNTER — Other Ambulatory Visit (HOSPITAL_BASED_OUTPATIENT_CLINIC_OR_DEPARTMENT_OTHER): Payer: Self-pay

## 2023-11-01 ENCOUNTER — Other Ambulatory Visit (HOSPITAL_COMMUNITY): Payer: Self-pay

## 2023-11-04 ENCOUNTER — Encounter (INDEPENDENT_AMBULATORY_CARE_PROVIDER_SITE_OTHER): Payer: Self-pay

## 2023-11-06 ENCOUNTER — Other Ambulatory Visit: Payer: Self-pay

## 2023-11-06 ENCOUNTER — Other Ambulatory Visit: Payer: Self-pay | Admitting: Pharmacy Technician

## 2023-11-06 NOTE — Progress Notes (Signed)
 Specialty Pharmacy Refill Coordination Note  Frank Hancock is a 38 y.o. male contacted today regarding refills of specialty medication(s)Emtricitabine -Tenofovir  AF (Descovy )    Patient requested Delivery Delivery date: 11/09/23 Verified address: [address] [place]  [address]   Medication will be filled on 11/08/23.   Patient answered Questionnaire

## 2023-11-07 ENCOUNTER — Other Ambulatory Visit: Payer: Self-pay

## 2023-11-07 ENCOUNTER — Ambulatory Visit (INDEPENDENT_AMBULATORY_CARE_PROVIDER_SITE_OTHER): Admitting: Medical

## 2023-11-07 ENCOUNTER — Encounter: Payer: Self-pay | Admitting: Medical

## 2023-11-07 ENCOUNTER — Other Ambulatory Visit (HOSPITAL_BASED_OUTPATIENT_CLINIC_OR_DEPARTMENT_OTHER): Payer: Self-pay

## 2023-11-07 ENCOUNTER — Other Ambulatory Visit (HOSPITAL_COMMUNITY)
Admission: RE | Admit: 2023-11-07 | Discharge: 2023-11-07 | Disposition: A | Discharge status: Home / Self Care | Source: Ambulatory Visit | Attending: Medical | Admitting: Medical

## 2023-11-07 VITALS — BP 140/90 | HR 78 | Temp 97.8°F | Resp 15 | Ht 65.0 in | Wt 174.2 lb

## 2023-11-07 DIAGNOSIS — I10 Essential (primary) hypertension: Secondary | ICD-10-CM

## 2023-11-07 DIAGNOSIS — Z7185 Encounter for immunization safety counseling: Secondary | ICD-10-CM

## 2023-11-07 DIAGNOSIS — Z113 Encounter for screening for infections with a predominantly sexual mode of transmission: Secondary | ICD-10-CM

## 2023-11-07 MED ORDER — LOSARTAN POTASSIUM 50 MG PO TABS
50.0000 mg | ORAL_TABLET | Freq: Every day | ORAL | 3 refills | Status: AC
  Filled 2023-11-07: qty 30, 30d supply, fill #0
  Filled 2024-01-04: qty 30, 30d supply, fill #1
  Filled 2024-01-30 (×2): qty 30, 30d supply, fill #2
  Filled 2024-03-04: qty 30, 30d supply, fill #3
  Filled 2024-04-02: qty 30, 30d supply, fill #4

## 2023-11-07 NOTE — Patient Instructions (Signed)
 Screening for sexually transmitted infections Requested STD panel due to new sexual partner. Experienced transient flu-like symptoms post-encounter. No genital symptoms. Herpes antibody testing not indicated without signs. - Order STD screening including gonorrhea, chlamydia, trichomoniasis, HIV, and RPR.   Essential hypertension Blood pressure at home 138-145/90-102 mmHg. Plan to increase losartan  for better control. Weight loss and exercise may aid in reduction. - Increase losartan  to 50 mg daily. - Prescribe 90 tablets of losartan  with three refills. - Monitor blood pressure at home two to three times a week and report via MyChart in two weeks. - Advise on proper technique for blood pressure measurement, including relaxing for 10 minutes before checking and using the left arm with palm upward.  General Health Maintenance Discussed COVID and flu vaccinations. No adverse reactions to previous COVID vaccines. Recommended timing of COVID vaccine to avoid side effects during travel. - Consider COVID vaccine 5-6 days before travel on September 11th. - Receive flu vaccine between now and the end of October. - Vaccines available downstairs.  Follow up date 06-2024 wellness exam or sooner if needed.

## 2023-11-07 NOTE — Progress Notes (Signed)
 Subjective:    Patient ID: Frank Hancock, male    DOB: September 20, 1985, 38 y.o.   MRN: [id number]  HPI  Frank Hancock is a 38 year old male who presents for an STD panel after experiencing flu-like symptoms.  He initiated a new sexual relationship approximately one month ago. Four days after the encounter, he developed flu-like symptoms including a low-grade fever, headache, and sore throat, which lasted for about four to five days. No genital symptoms such as discharge, testicular pain, or blisters.  He is currently on Descovy  for PrEP, prescribed by an infectious disease specialist. He monitors his blood pressure at home, with readings typically ranging from 138 to 145 systolic and 90 to 102 diastolic.  He has received multiple COVID-19 vaccinations without adverse side effects and has had COVID-19 in the past.  He has lost approximately 15 pounds through increased exercise, which he notes occurred before his current visit.       Review of Systems  Constitutional:  Negative for chills, fatigue and fever.  Respiratory:  Negative for cough, chest tightness, shortness of breath and wheezing.   Cardiovascular:  Negative for chest pain and palpitations.  Gastrointestinal:  Negative for abdominal pain, blood in stool and nausea.  Genitourinary:  Negative for flank pain and frequency.  Musculoskeletal:  Negative for back pain and myalgias.  Skin:  Negative for rash.  Neurological:  Negative for dizziness, speech difficulty, weakness and headaches.  Hematological:  Negative for adenopathy.  Psychiatric/Behavioral:  Negative for behavioral problems, decreased concentration and dysphoric mood. The patient is not nervous/anxious.    Past Medical History:  Diagnosis Date   Chest pain    Frequent headaches    H/O syncope    pre- syncope, no LOC   History of palpitations    Hyperlipidemia    Hypertension          Social History   Socioeconomic History   Marital status: Single     Spouse name: Not on file   Number of children: 0   Years of education: Not on file   Highest education level: Not on file  Occupational History   Occupation: Insurance underwriter - [employer]    Comment: Computer  Tobacco Use   Smoking status: Never   Smokeless tobacco: Never  Substance and Sexual Activity   Alcohol use: Yes    Alcohol/week: 0.0 standard drinks of alcohol    Comment: Occasional   Drug use: No   Sexual activity: Not on file  Other Topics Concern   Not on file  Social History Narrative   Born in [place]   Moved from [place]  2016, lives w/ mother    Social Drivers of Corporate investment banker Strain: Low Risk  (02/22/2023)   Received from [facility]   Overall Financial Resource Strain (CARDIA)    Difficulty of Paying Living Expenses: Not hard at all  Food Insecurity: Low Risk  (06/15/2023)   Received from [facility]   Hunger Vital Sign    Within the past 12 months, you worried that your food would run out before you got money to buy more: Never true    Within the past 12 months, the food you bought just didn't last and you didn't have money to get more. : Never true  Transportation Needs: No Transportation Needs (06/15/2023)   Received from [facility]    In the past 12 months, has lack of reliable transportation kept you from  medical appointments, meetings, work or from getting things needed for daily living? : No  Physical Activity: Not on file  Stress: No Stress Concern Present (12/19/2019)   Received from [facility] - Occupational Stress Questionnaire    Feeling of Stress : Not at all  Social Connections: Unknown (07/03/2021)   Received from [facility]   Social Network    Social Network: Not on file  Intimate Partner Violence: Unknown (06/09/2021)   Received from [facility]   HITS    Physically Hurt: Not on file    Insult or Talk Down To: Not on file    Threaten Physical Harm: Not  on file    Scream or Curse: Not on file    Past Surgical History:  Procedure Laterality Date   CHEST SURGERY  2001   cosmetic, breast reduction    Family History  Problem Relation Age of Onset   Hypertension Mother    Hypertension Sister    Diabetes Brother    Hypertension Brother    Heart defect Maternal Grandmother        valve repair   Colon cancer Neg Hx    Prostate cancer Neg Hx    CAD Neg Hx     No Known Allergies  Current Outpatient Medications on File Prior to Visit  Medication Sig Dispense Refill   emtricitabine -tenofovir  AF (DESCOVY ) 200-25 MG tablet Take 1 tablet by mouth daily. 30 tablet 2   No current facility-administered medications on file prior to visit.    BP (!) 140/90   Pulse 78   Temp 97.8 F (36.6 C) (Oral)   Resp 15   Ht 5' 5 (1.651 m)   Wt 174 lb 3.2 oz (79 kg)   SpO2 99%   BMI 28.99 kg/m         Objective:   Physical Exam  General Mental Status- Alert. General Appearance- Not in acute distress.   Skin General: Color- Normal Color. Moisture- Normal Moisture.  Neck Carotid Arteries- Normal color. Moisture- Normal Moisture. No carotid bruits. No JVD.  Chest and Lung Exam Auscultation: Breath Sounds:-CTA  Cardiovascular Auscultation:Rythm- RRR Murmurs & Other Heart Sounds:Auscultation of the heart reveals- No Murmurs.  Abdomen Inspection:-Inspeection Normal. Palpation/Percussion:Note:No mass. Palpation and Percussion of the abdomen reveal- Non Tender, Non Distended + BS, no rebound or guarding.   Neurologic Cranial Nerve exam:- CN III-XII intact(No nystagmus), symmetric smile. Strength:- 5/5 equal and symmetric strength both upper and lower extremities.       Assessment & Plan:   Patient Instructions  Screening for sexually transmitted infections Requested STD panel due to new sexual partner. Experienced transient flu-like symptoms post-encounter. No genital symptoms. Herpes antibody testing not indicated without  signs. - Order STD screening including gonorrhea, chlamydia, trichomoniasis, HIV, and RPR.   Essential hypertension Blood pressure at home 138-145/90-102 mmHg. Plan to increase losartan  for better control. Weight loss and exercise may aid in reduction. - Increase losartan  to 50 mg daily. - Prescribe 90 tablets of losartan  with three refills. - Monitor blood pressure at home two to three times a week and report via MyChart in two weeks. - Advise on proper technique for blood pressure measurement, including relaxing for 10 minutes before checking and using the left arm with palm upward.  General Health Maintenance Discussed COVID and flu vaccinations. No adverse reactions to previous COVID vaccines. Recommended timing of COVID vaccine to avoid side effects during travel. - Consider COVID vaccine 5-6 days  before travel on September 11th. - Receive flu vaccine between now and the end of October. - Vaccines available downstairs.  Follow up date 06-2024 wellness exam or sooner if needed.   Harper Smoker, PA-C

## 2023-11-08 ENCOUNTER — Ambulatory Visit: Payer: Self-pay | Admitting: Medical

## 2023-11-08 LAB — HIV ANTIBODY (ROUTINE TESTING W REFLEX): HIV 1&2 Ab, 4th Generation: NONREACTIVE

## 2023-11-08 LAB — RPR: RPR Ser Ql: NONREACTIVE

## 2023-11-09 LAB — URINE CYTOLOGY ANCILLARY ONLY
Chlamydia: NEGATIVE
Comment: NEGATIVE
Comment: NEGATIVE
Comment: NORMAL
Neisseria Gonorrhea: NEGATIVE
Trichomonas: NEGATIVE

## 2023-11-30 ENCOUNTER — Other Ambulatory Visit: Payer: Self-pay

## 2023-11-30 ENCOUNTER — Encounter (INDEPENDENT_AMBULATORY_CARE_PROVIDER_SITE_OTHER): Payer: Self-pay

## 2023-11-30 NOTE — Progress Notes (Signed)
 Specialty Pharmacy Refill Coordination Note  Frank Hancock is a 38 y.o. male contacted today regarding refills of specialty medication(s) Emtricitabine -Tenofovir  AF (Descovy )   Patient requested (Patient-Rptd) Delivery   Delivery date: 12/04/23   Verified address: (Patient-Rptd) [address] [place] [address]   Medication will be filled on 12/03/23.

## 2023-12-17 ENCOUNTER — Ambulatory Visit: Admitting: Pharmacist

## 2023-12-19 ENCOUNTER — Other Ambulatory Visit: Payer: Self-pay

## 2023-12-19 ENCOUNTER — Ambulatory Visit (INDEPENDENT_AMBULATORY_CARE_PROVIDER_SITE_OTHER): Admitting: Pharmacist

## 2023-12-19 ENCOUNTER — Other Ambulatory Visit (HOSPITAL_COMMUNITY)
Admission: RE | Admit: 2023-12-19 | Discharge: 2023-12-19 | Disposition: A | Discharge status: Home / Self Care | Source: Ambulatory Visit | Attending: Infectious Diseases | Admitting: Infectious Diseases

## 2023-12-19 DIAGNOSIS — Z113 Encounter for screening for infections with a predominantly sexual mode of transmission: Secondary | ICD-10-CM | POA: Insufficient documentation

## 2023-12-19 DIAGNOSIS — Z79899 Other long term (current) drug therapy: Secondary | ICD-10-CM | POA: Diagnosis not present

## 2023-12-19 NOTE — Progress Notes (Addendum)
 HPI: Frank Hancock is a 38 y.o. male who presents to the [facility] pharmacy clinic for HIV PrEP follow-up.  Referring ID Physician: Dr. Fleeta Rothman   Patient Active Problem List   Diagnosis Date Noted   Annual physical exam 12/21/2015   PCP NOTES >>>>>>>>>>>>>>>>>>>>> 10/01/2015   Anxiety state 10/01/2015   Dyspnea 06/24/2014   Chest pain 06/24/2014   Palpitations 06/24/2014    Patient's Medications  New Prescriptions   No medications on file  Previous Medications   EMTRICITABINE -TENOFOVIR  AF (DESCOVY ) 200-25 MG TABLET    Take 1 tablet by mouth daily.   LOSARTAN  (COZAAR ) 50 MG TABLET    Take 1 tablet (50 mg total) by mouth daily.  Modified Medications   No medications on file  Discontinued Medications   No medications on file       08/05/2018    3:18 PM 02/04/2018   10:55 AM 11/07/2017   11:39 AM 10/04/2017   11:41 AM  CHL HIV PREP FLOWSHEET RESULTS  Insurance Status Insured Insured Insured Insured  Gender at birth Male Male Male Male  Gender identity cis-Male  cis-Male  cis-Male  cis-Male   Risk for HIV Hx of STI   Condomless vaginal or anal intercourse    Sex Partners Women only Women only Women only Women only  # sex partners past 3-6 mos  --  1-3  1-3   Sex activity preferences  Insertive Insertive   Condom use   Yes Yes  % condom use   90 90  Treated for STI? No No  No  HIV symptoms? N/A  N/A  N/A  N/A   PrEP Eligibility Substantial risk for HIV         Data saved with a previous flowsheet row definition    Labs:  SCr: Lab Results  Component Value Date   CREATININE 0.86 06/19/2023   CREATININE 0.84 03/13/2022   CREATININE 0.93 03/15/2021   CREATININE 0.81 05/14/2018   CREATININE 0.88 11/07/2017   HIV Lab Results  Component Value Date   HIV NON-REACTIVE 11/07/2023   HIV NON-REACTIVE 06/13/2023   HIV NON-REACTIVE 03/21/2023   HIV NON-REACTIVE 12/13/2022   HIV NON-REACTIVE 09/11/2022   Hepatitis B Lab Results  Component Value Date   HEPBSAB REACTIVE (A)  08/05/2018   HEPBSAG NON-REACTIVE 10/04/2017   Hepatitis C Lab Results  Component Value Date   HEPCAB NON-REACTIVE 10/04/2017   Hepatitis A Lab Results  Component Value Date   HAV REACTIVE (A) 10/04/2017   RPR and STI Lab Results  Component Value Date   LABRPR NON-REACTIVE 11/07/2023   LABRPR NON-REACTIVE 09/11/2023   LABRPR NON-REACTIVE 06/13/2023   LABRPR NON-REACTIVE 03/21/2023   LABRPR NON-REACTIVE 12/13/2022    STI Results GC CT  11/07/2023  2:18 PM Negative  Negative   09/11/2023  1:36 PM Negative  Negative   06/13/2023  1:28 PM Negative    Negative  Negative    Negative   03/21/2023  1:21 PM Negative  Negative   12/13/2022  9:54 AM Negative  Negative   06/12/2022 10:25 AM Negative  Negative   03/13/2022 10:08 AM Negative  Negative   09/12/2021  2:36 PM Negative  Negative   06/13/2021  2:50 PM Negative  Negative   03/15/2021  2:14 PM Negative  Negative   11/07/2017 12:00 AM Negative  Negative   10/04/2017 12:00 AM Negative  Negative     Assessment: Frank Hancock presents today for HIV prevention follow up. He takes Descovy  daily  without any issues or concerns. Last HIV ab was negative on 11/07/23 at PCP's office after a new sexual partner. Screened for acute HIV symptoms such as fatigue, muscle aches, rash, sore throat, lymphadenopathy, headache, night sweats, nausea/vomiting/diarrhea, and fever. Denies any symptoms. Last lipid panel and renal function checked in April 2025 and were normal. Next check due in April 2026. He already received his flu vaccine on 11/17/23. Will see him back in 3 months.   Plan: - HIV ab, RPR, and urine cytology today - Descovy  x 3 months if HIV negative  - Follow up with me again on 03/19/24  Niya Behler L. Latecia Miler, PharmD, BCIDP, AAHIVP, CPP Clinical Pharmacist Practitioner - Infectious Diseases Clinical Pharmacist Lead - Specialty Pharmacy [facility]

## 2023-12-20 LAB — HIV ANTIBODY (ROUTINE TESTING W REFLEX)
HIV 1&2 Ab, 4th Generation: NONREACTIVE
HIV FINAL INTERPRETATION: NEGATIVE

## 2023-12-20 LAB — URINE CYTOLOGY ANCILLARY ONLY
Chlamydia: NEGATIVE
Comment: NEGATIVE
Comment: NORMAL
Neisseria Gonorrhea: NEGATIVE

## 2023-12-20 LAB — RPR: RPR Ser Ql: NONREACTIVE

## 2023-12-21 ENCOUNTER — Other Ambulatory Visit: Payer: Self-pay | Admitting: Pharmacist

## 2023-12-21 ENCOUNTER — Other Ambulatory Visit: Payer: Self-pay

## 2023-12-21 DIAGNOSIS — Z79899 Other long term (current) drug therapy: Secondary | ICD-10-CM

## 2023-12-21 MED ORDER — DESCOVY 200-25 MG PO TABS
1.0000 | ORAL_TABLET | Freq: Every day | ORAL | 2 refills | Status: DC
  Filled 2023-12-21 – 2023-12-28 (×2): qty 30, 30d supply, fill #0
  Filled 2024-01-24 – 2024-02-01 (×2): qty 30, 30d supply, fill #1
  Filled 2024-02-29 – 2024-03-03 (×2): qty 30, 30d supply, fill #2

## 2023-12-28 ENCOUNTER — Other Ambulatory Visit: Payer: Self-pay

## 2023-12-28 NOTE — Progress Notes (Signed)
 Specialty Pharmacy Refill Coordination Note  Frank Hancock is a 38 y.o. male contacted today regarding refills of specialty medication(s) Emtricitabine -Tenofovir  AF (Descovy )   Patient requested (Patient-Rptd) Delivery   Delivery date: 01/02/24   Verified address: (Patient-Rptd) [address] [place] [address]   Medication will be filled on 01/01/24.

## 2024-01-04 ENCOUNTER — Other Ambulatory Visit (HOSPITAL_BASED_OUTPATIENT_CLINIC_OR_DEPARTMENT_OTHER): Payer: Self-pay

## 2024-01-18 ENCOUNTER — Other Ambulatory Visit: Payer: Self-pay

## 2024-01-24 ENCOUNTER — Other Ambulatory Visit (HOSPITAL_COMMUNITY): Payer: Self-pay

## 2024-01-30 ENCOUNTER — Other Ambulatory Visit (HOSPITAL_BASED_OUTPATIENT_CLINIC_OR_DEPARTMENT_OTHER): Payer: Self-pay

## 2024-02-01 ENCOUNTER — Other Ambulatory Visit: Payer: Self-pay

## 2024-02-01 ENCOUNTER — Other Ambulatory Visit (HOSPITAL_COMMUNITY): Payer: Self-pay

## 2024-02-05 ENCOUNTER — Other Ambulatory Visit: Payer: Self-pay

## 2024-02-05 ENCOUNTER — Other Ambulatory Visit (HOSPITAL_COMMUNITY): Payer: Self-pay

## 2024-02-05 NOTE — Progress Notes (Signed)
 Specialty Pharmacy Refill Coordination Note  Frank Hancock is a 38 y.o. male contacted today regarding refills of specialty medication(s) Emtricitabine -Tenofovir  AF (Descovy )   Patient requested Delivery   Delivery date: 02/11/24   Verified address: [address] [place] [address]   Medication will be filled on: 02/08/24

## 2024-02-07 ENCOUNTER — Other Ambulatory Visit: Payer: Self-pay

## 2024-02-29 ENCOUNTER — Other Ambulatory Visit: Payer: Self-pay

## 2024-03-03 ENCOUNTER — Other Ambulatory Visit (HOSPITAL_COMMUNITY): Payer: Self-pay

## 2024-03-05 ENCOUNTER — Other Ambulatory Visit: Payer: Self-pay

## 2024-03-05 ENCOUNTER — Other Ambulatory Visit (HOSPITAL_BASED_OUTPATIENT_CLINIC_OR_DEPARTMENT_OTHER): Payer: Self-pay

## 2024-03-05 NOTE — Progress Notes (Signed)
 Specialty Pharmacy Refill Coordination Note  Frank Hancock is a 38 y.o. male contacted today regarding refills of specialty medication(s) Emtricitabine -Tenofovir  AF (Descovy )   Patient requested Delivery   Delivery date: 03/10/24   Verified address: [address] [place] [address]   Medication will be filled on: 03/07/24

## 2024-03-18 NOTE — Progress Notes (Unsigned)
 "  HPI: Frank Hancock is a 39 y.o. male who presents to the [facility] pharmacy clinic for HIV PrEP follow-up.  Referring ID Physician: Dr. Fleeta Rothman  Patient Active Problem List   Diagnosis Date Noted   Annual physical exam 12/21/2015   PCP NOTES >>>>>>>>>>>>>>>>>>>>> 10/01/2015   Anxiety state 10/01/2015   Dyspnea 06/24/2014   Chest pain 06/24/2014   Palpitations 06/24/2014    Patient's Medications  New Prescriptions   No medications on file  Previous Medications   EMTRICITABINE -TENOFOVIR  AF (DESCOVY ) 200-25 MG TABLET    Take 1 tablet by mouth daily.   LOSARTAN  (COZAAR ) 50 MG TABLET    Take 1 tablet (50 mg total) by mouth daily.  Modified Medications   No medications on file  Discontinued Medications   No medications on file       08/05/2018    3:18 PM 02/04/2018   10:55 AM 11/07/2017   11:39 AM 10/04/2017   11:41 AM  CHL HIV PREP FLOWSHEET RESULTS  Insurance Status Insured Insured Insured Insured  Gender at birth Male Male Male Male  Gender identity cis-Male  cis-Male  cis-Male  cis-Male   Risk for HIV Hx of STI   Condomless vaginal or anal intercourse    Sex Partners Women only Women only Women only Women only  # sex partners past 3-6 mos  --  1-3  1-3   Sex activity preferences  Insertive Insertive   Condom use   Yes Yes  % condom use   90 90  Treated for STI? No No  No  HIV symptoms? N/A  N/A  N/A  N/A   PrEP Eligibility Substantial risk for HIV         Data saved with a previous flowsheet row definition    Labs:  SCr: Lab Results  Component Value Date   CREATININE 0.86 06/19/2023   CREATININE 0.84 03/13/2022   CREATININE 0.93 03/15/2021   CREATININE 0.81 05/14/2018   CREATININE 0.88 11/07/2017   HIV Lab Results  Component Value Date   HIV NON-REACTIVE 12/19/2023   HIV NON-REACTIVE 11/07/2023   HIV NON-REACTIVE 06/13/2023   HIV NON-REACTIVE 03/21/2023   HIV NON-REACTIVE 12/13/2022   Hepatitis B Lab Results  Component Value Date   HEPBSAB REACTIVE (A)  08/05/2018   HEPBSAG NON-REACTIVE 10/04/2017   Hepatitis C Lab Results  Component Value Date   HEPCAB NON-REACTIVE 10/04/2017   Hepatitis A Lab Results  Component Value Date   HAV REACTIVE (A) 10/04/2017   RPR and STI Lab Results  Component Value Date   LABRPR NON-REACTIVE 12/19/2023   LABRPR NON-REACTIVE 11/07/2023   LABRPR NON-REACTIVE 09/11/2023   LABRPR NON-REACTIVE 06/13/2023   LABRPR NON-REACTIVE 03/21/2023    STI Results GC CT  12/19/2023  1:37 PM Negative  Negative   11/07/2023  2:18 PM Negative  Negative   09/11/2023  1:36 PM Negative  Negative   06/13/2023  1:28 PM Negative    Negative  Negative    Negative   03/21/2023  1:21 PM Negative  Negative   12/13/2022  9:54 AM Negative  Negative   06/12/2022 10:25 AM Negative  Negative   03/13/2022 10:08 AM Negative  Negative   09/12/2021  2:36 PM Negative  Negative   06/13/2021  2:50 PM Negative  Negative   03/15/2021  2:14 PM Negative  Negative   11/07/2017 12:00 AM Negative  Negative   10/04/2017 12:00 AM Negative  Negative    Assessment: Frank Hancock presents today for  HIV prevention follow up. He takes Descovy  daily without any issues or concerns. Screened for acute HIV symptoms such as fatigue, muscle aches, rash, sore throat, lymphadenopathy, headache, night sweats, nausea/vomiting/diarrhea, and fever. Denies any symptoms. Last HIV screen was negative on 12/19/23. Last lipid panel and renal function checked in April 2025 and were normal. Next check due in April 2026. Offered STI testing; accepted urine STI testing today.   Oak inquired about long-acting injections for PrEP. I counseled on dosing, efficacy, and adverse effects for Apretude and Yeztugo. Frank Hancock said he was potentially interested in Apretude in the future, and will think about this option but is content with Descovy  currently.   Eligible vaccinations: Offered COVID-19 vaccine; politely declines today.  Plan: - Descovy  x 3 months if HIV negative - Will  investigate insurance coverage of Apretude - HIV testing, STI testing today - Next appointment 06/17/24 with Charlott Maurilio Patten, PharmD PGY1 Pharmacy Resident [facility] 03/18/2024 9:06 PM "

## 2024-03-19 ENCOUNTER — Telehealth: Payer: Self-pay

## 2024-03-19 ENCOUNTER — Other Ambulatory Visit (HOSPITAL_COMMUNITY): Payer: Self-pay

## 2024-03-19 ENCOUNTER — Other Ambulatory Visit (HOSPITAL_COMMUNITY)
Admission: RE | Admit: 2024-03-19 | Discharge: 2024-03-19 | Disposition: A | Discharge status: Home / Self Care | Source: Ambulatory Visit | Attending: Infectious Diseases | Admitting: Infectious Diseases

## 2024-03-19 ENCOUNTER — Other Ambulatory Visit: Payer: Self-pay

## 2024-03-19 ENCOUNTER — Ambulatory Visit: Admitting: Pharmacist

## 2024-03-19 DIAGNOSIS — Z113 Encounter for screening for infections with a predominantly sexual mode of transmission: Secondary | ICD-10-CM

## 2024-03-19 DIAGNOSIS — Z79899 Other long term (current) drug therapy: Secondary | ICD-10-CM

## 2024-03-19 DIAGNOSIS — Z2981 Encounter for HIV pre-exposure prophylaxis: Secondary | ICD-10-CM | POA: Diagnosis not present

## 2024-03-19 NOTE — Progress Notes (Signed)
 SABRA

## 2024-03-19 NOTE — Telephone Encounter (Signed)
 Pharmacy Patient Advocate Encounter- Apretude BIV-Medical Benefit:  J code: [id number] CPT code: 03627  Dx Code: Z20.6  PA was not needed    Estimated Patient cost is: 0%

## 2024-03-20 LAB — URINE CYTOLOGY ANCILLARY ONLY
Chlamydia: NEGATIVE
Comment: NEGATIVE
Comment: NORMAL
Neisseria Gonorrhea: NEGATIVE

## 2024-03-20 LAB — HIV ANTIBODY (ROUTINE TESTING W REFLEX)
HIV 1&2 Ab, 4th Generation: NONREACTIVE
HIV FINAL INTERPRETATION: NEGATIVE

## 2024-03-21 ENCOUNTER — Other Ambulatory Visit: Payer: Self-pay | Admitting: Pharmacist

## 2024-03-21 DIAGNOSIS — Z79899 Other long term (current) drug therapy: Secondary | ICD-10-CM

## 2024-03-21 MED ORDER — DESCOVY 200-25 MG PO TABS
1.0000 | ORAL_TABLET | Freq: Every day | ORAL | 2 refills | Status: AC
  Filled 2024-03-24 – 2024-04-02 (×2): qty 30, 30d supply, fill #0

## 2024-03-24 ENCOUNTER — Other Ambulatory Visit (HOSPITAL_COMMUNITY): Payer: Self-pay

## 2024-03-24 ENCOUNTER — Other Ambulatory Visit: Payer: Self-pay

## 2024-03-25 ENCOUNTER — Telehealth: Payer: Self-pay

## 2024-03-25 NOTE — Progress Notes (Signed)
 Called Frank Hancock to let him know his Apretude was covered by his insurance and there would be no patient cost for the medicine. Asked if he remained interested in starting Apretude. He said he understands and would like to stay on Descovy  at this time but might think about starting it in the future.   Maurilio Patten, PharmD PGY1 Pharmacy Resident [facility] 03/25/2024 11:33 AM

## 2024-03-26 NOTE — Telephone Encounter (Signed)
 Patient wishes to stay on Descovy  for now. Thanks!

## 2024-03-28 ENCOUNTER — Other Ambulatory Visit (HOSPITAL_COMMUNITY): Payer: Self-pay

## 2024-04-02 ENCOUNTER — Other Ambulatory Visit (HOSPITAL_BASED_OUTPATIENT_CLINIC_OR_DEPARTMENT_OTHER): Payer: Self-pay

## 2024-04-02 ENCOUNTER — Other Ambulatory Visit: Payer: Self-pay

## 2024-04-04 ENCOUNTER — Other Ambulatory Visit: Payer: Self-pay

## 2024-04-04 ENCOUNTER — Other Ambulatory Visit: Payer: Self-pay | Admitting: Pharmacy Technician

## 2024-04-04 NOTE — Progress Notes (Signed)
 Specialty Pharmacy Refill Coordination Note  Frank Hancock is a 39 y.o. male contacted today regarding refills of specialty medication(s) Emtricitabine -Tenofovir  AF (Descovy )   Patient requested Delivery   Delivery date: 04/10/24   Verified address: [address] [place] [address]   Medication will be filled on: 04/09/24

## 2024-04-09 ENCOUNTER — Other Ambulatory Visit: Payer: Self-pay

## 2024-06-17 ENCOUNTER — Ambulatory Visit: Payer: Self-pay | Admitting: Pharmacist

## 2024-06-20 ENCOUNTER — Encounter: Admitting: Medical
